# Patient Record
Sex: Male | Born: 1955
Health system: Southern US, Community
[De-identification: ages and names within clinical notes are randomized; demographics above are authoritative.]

## PROBLEM LIST (undated history)

## (undated) DIAGNOSIS — I10 Essential (primary) hypertension: Secondary | ICD-10-CM

## (undated) HISTORY — DX: Essential (primary) hypertension: I10

---

## 2017-06-11 ENCOUNTER — Encounter: Payer: Self-pay | Admitting: Emergency Medicine

## 2017-06-11 ENCOUNTER — Ambulatory Visit (INDEPENDENT_AMBULATORY_CARE_PROVIDER_SITE_OTHER): Payer: 59 | Admitting: Emergency Medicine

## 2017-06-11 VITALS — BP 124/80 | HR 71 | Temp 98.3°F | Resp 16 | Ht 68.25 in | Wt 194.4 lb

## 2017-06-11 DIAGNOSIS — Z0000 Encounter for general adult medical examination without abnormal findings: Secondary | ICD-10-CM

## 2017-06-11 DIAGNOSIS — Z1211 Encounter for screening for malignant neoplasm of colon: Secondary | ICD-10-CM

## 2017-06-11 NOTE — Progress Notes (Signed)
Erik Mcgee 61 y.o.   Chief Complaint  Patient presents with  . Annual Exam    HISTORY OF PRESENT ILLNESS: This is a 61 y.o. male here for annual exam. No complaints or medical concerns. Smoking:no Drinking:no Sleeping:adequate Work:regular hours Exercise:pysically active Stress:low Nutrition:good   HPI   Prior to Admission medications   Not on File    No Known Allergies  There are no active problems to display for this patient.   Past Medical History:  Diagnosis Date  . Hypertension    Takes Losartan/HCTZ but doesn't know the dose.  No past surgical history on file.  Social History   Social History  . Marital status: Married    Spouse name: N/A  . Number of children: N/A  . Years of education: N/A   Occupational History  . Not on file.   Social History Main Topics  . Smoking status: Never Smoker  . Smokeless tobacco: Never Used  . Alcohol use No  . Drug use: No  . Sexual activity: Not on file   Other Topics Concern  . Not on file   Social History Narrative  . No narrative on file    No family history on file.   Review of Systems  Constitutional: Negative.  Negative for chills, fever and malaise/fatigue.  HENT: Negative.  Negative for congestion, hearing loss, nosebleeds, sinus pain and sore throat.   Eyes: Negative.  Negative for blurred vision and double vision.  Respiratory: Negative for sputum production and shortness of breath.   Cardiovascular: Negative.  Negative for chest pain, palpitations, claudication and leg swelling.  Gastrointestinal: Negative.  Negative for abdominal pain, diarrhea, heartburn, nausea and vomiting.  Genitourinary: Negative.  Negative for dysuria, flank pain and hematuria.  Musculoskeletal: Negative.  Negative for myalgias and neck pain.  Skin: Negative.  Negative for rash.  Neurological: Negative.  Negative for dizziness and headaches.  Endo/Heme/Allergies: Negative.   All other systems reviewed and are  negative.  Vitals:   06/11/17 1111  BP: 124/80  Pulse: 71  Resp: 16  Temp: 98.3 F (36.8 C)  SpO2: 97%     Physical Exam  Constitutional: He is oriented to person, place, and time. He appears well-developed and well-nourished.  HENT:  Head: Normocephalic and atraumatic.  Right Ear: External ear normal.  Left Ear: External ear normal.  Nose: Nose normal.  Mouth/Throat: Oropharynx is clear and moist.  Eyes: Pupils are equal, round, and reactive to light. Conjunctivae and EOM are normal.  Neck: Normal range of motion. Neck supple. No JVD present. No thyromegaly present.  Cardiovascular: Normal rate, regular rhythm and normal heart sounds.   Pulmonary/Chest: Effort normal and breath sounds normal.  Abdominal: Soft. Bowel sounds are normal. He exhibits no distension. There is no tenderness.  Musculoskeletal: Normal range of motion. He exhibits no edema or tenderness.  Lymphadenopathy:    He has no cervical adenopathy.  Neurological: He is alert and oriented to person, place, and time. No sensory deficit. He exhibits normal muscle tone.  Skin: Skin is warm and dry. Capillary refill takes less than 2 seconds. No rash noted.  Psychiatric: He has a normal mood and affect. His behavior is normal.  Vitals reviewed.    ASSESSMENT & PLAN: Erik Mcgee was seen today for annual exam.  Diagnoses and all orders for this visit:  Routine general medical examination at a health care facility -     CBC with Differential -     Comprehensive metabolic panel -  Hemoglobin A1c -     Lipid panel -     PSA(Must document that pt has been informed of limitations of PSA testing.) -     TSH -     Hepatitis C antibody screen -     HIV antibody -     Ambulatory referral to Gastroenterology    Patient Instructions       IF you received an x-ray today, you will receive an invoice from [facility]. Please contact [facility] at [phone] with questions or concerns regarding  your invoice.   IF you received labwork today, you will receive an invoice from [facility]. Please contact [facility] at [phone] with questions or concerns regarding your invoice.   Our billing staff will not be able to assist you with questions regarding bills from these companies.  You will be contacted with the lab results as soon as they are available. The fastest way to get your results is to activate your My Chart account. Instructions are located on the last page of this paperwork. If you have not heard from [place] regarding the results in 2 weeks, please contact this office.        Health Maintenance, Male A healthy lifestyle and preventive care is important for your health and wellness. Ask your health care provider about what schedule of regular examinations is right for you. What should I know about weight and diet? Eat a Healthy Diet  Eat plenty of vegetables, fruits, whole grains, low-fat dairy products, and lean protein.  Do not eat a lot of foods high in solid fats, added sugars, or salt.  Maintain a Healthy Weight Regular exercise can help you achieve or maintain a healthy weight. You should:  Do at least 150 minutes of exercise each week. The exercise should increase your heart rate and make you sweat (moderate-intensity exercise).  Do strength-training exercises at least twice a week.  Watch Your Levels of Cholesterol and Blood Lipids  Have your blood tested for lipids and cholesterol every 5 years starting at 60 years of age. If you are at high risk for heart disease, you should start having your blood tested when you are 60 years old. You may need to have your cholesterol levels checked more often if: ? Your lipid or cholesterol levels are high. ? You are older than 60 years of age. ? You are at high risk for heart disease.  What should I know about cancer screening? Many types of cancers can be detected early and may often be prevented. Lung Cancer  You  should be screened every year for lung cancer if: ? You are a current smoker who has smoked for at least 30 years. ? You are a former smoker who has quit within the past 15 years.  Talk to your health care provider about your screening options, when you should start screening, and how often you should be screened.  Colorectal Cancer  Routine colorectal cancer screening usually begins at 60 years of age and should be repeated every 5-10 years until you are 60 years old. You may need to be screened more often if early forms of precancerous polyps or small growths are found. Your health care provider may recommend screening at an earlier age if you have risk factors for colon cancer.  Your health care provider may recommend using home test kits to check for hidden blood in the stool.  A small camera at the end of a tube can be used to examine  your colon (sigmoidoscopy or colonoscopy). This checks for the earliest forms of colorectal cancer.  Prostate and Testicular Cancer  Depending on your age and overall health, your health care provider may do certain tests to screen for prostate and testicular cancer.  Talk to your health care provider about any symptoms or concerns you have about testicular or prostate cancer.  Skin Cancer  Check your skin from head to toe regularly.  Tell your health care provider about any new moles or changes in moles, especially if: ? There is a change in a mole's size, shape, or color. ? You have a mole that is larger than a pencil eraser.  Always use sunscreen. Apply sunscreen liberally and repeat throughout the day.  Protect yourself by wearing long sleeves, pants, a wide-brimmed hat, and sunglasses when outside.  What should I know about heart disease, diabetes, and high blood pressure?  If you are 18-61 years of age, have your blood pressure checked every 3-5 years. If you are 16 years of age or older, have your blood pressure checked every year. You  should have your blood pressure measured twice-once when you are at a hospital or clinic, and once when you are not at a hospital or clinic. Record the average of the two measurements. To check your blood pressure when you are not at a hospital or clinic, you can use: ? An automated blood pressure machine at a pharmacy. ? A home blood pressure monitor.  Talk to your health care provider about your target blood pressure.  If you are between 10-13 years old, ask your health care provider if you should take aspirin to prevent heart disease.  Have regular diabetes screenings by checking your fasting blood sugar level. ? If you are at a normal weight and have a low risk for diabetes, have this test once every three years after the age of 47. ? If you are overweight and have a high risk for diabetes, consider being tested at a younger age or more often.  A one-time screening for abdominal aortic aneurysm (AAA) by ultrasound is recommended for men aged 65-75 years who are current or former smokers. What should I know about preventing infection? Hepatitis B If you have a higher risk for hepatitis B, you should be screened for this virus. Talk with your health care provider to find out if you are at risk for hepatitis B infection. Hepatitis C Blood testing is recommended for:  Everyone born from [birth year] through 1964.  Anyone with known risk factors for hepatitis C.  Sexually Transmitted Diseases (STDs)  You should be screened each year for STDs including gonorrhea and chlamydia if: ? You are sexually active and are younger than 60 years of age. ? You are older than 60 years of age and your health care provider tells you that you are at risk for this type of infection. ? Your sexual activity has changed since you were last screened and you are at an increased risk for chlamydia or gonorrhea. Ask your health care provider if you are at risk.  Talk with your health care provider about whether you are  at high risk of being infected with HIV. Your health care provider may recommend a prescription medicine to help prevent HIV infection.  What else can I do?  Schedule regular health, dental, and eye exams.  Stay current with your vaccines (immunizations).  Do not use any tobacco products, such as cigarettes, chewing tobacco, and e-cigarettes. If you need help  quitting, ask your health care provider.  Limit alcohol intake to no more than 2 drinks per day. One drink equals 12 ounces of beer, 5 ounces of wine, or 1 ounces of hard liquor.  Do not use street drugs.  Do not share needles.  Ask your health care provider for help if you need support or information about quitting drugs.  Tell your health care provider if you often feel depressed.  Tell your health care provider if you have ever been abused or do not feel safe at home. This information is not intended to replace advice given to you by your health care provider. Make sure you discuss any questions you have with your health care provider. Document Released: 02/13/2008 Document Revised: 04/15/2016 Document Reviewed: 05/21/2015 Elsevier Interactive Patient Education  2018 [employer].  American Heart Association (AHA) Exercise Recommendation  Being physically active is important to prevent heart disease and stroke, the nation's No. 1and No. 5killers. To improve overall cardiovascular health, we suggest at least 150 minutes per week of moderate exercise or 75 minutes per week of vigorous exercise (or a combination of moderate and vigorous activity). Thirty minutes a day, five times a week is an easy goal to remember. You will also experience benefits even if you divide your time into two or three segments of 10 to 15 minutes per day.  For people who would benefit from lowering their blood pressure or cholesterol, we recommend 40 minutes of aerobic exercise of moderate to vigorous intensity three to four times a week to lower the  risk for heart attack and stroke.  Physical activity is anything that makes you move your body and burn calories.  This includes things like climbing stairs or playing sports. Aerobic exercises benefit your heart, and include walking, jogging, swimming or biking. Strength and stretching exercises are best for overall stamina and flexibility.  The simplest, positive change you can make to effectively improve your heart health is to start walking. It's enjoyable, free, easy, social and great exercise. A walking program is flexible and boasts high success rates because people can stick with it. It's easy for walking to become a regular and satisfying part of life.   For Overall Cardiovascular Health:  At least 30 minutes of moderate-intensity aerobic activity at least 5 days per week for a total of 150  OR   At least 25 minutes of vigorous aerobic activity at least 3 days per week for a total of 75 minutes; or a combination of moderate- and vigorous-intensity aerobic activity  AND   Moderate- to high-intensity muscle-strengthening activity at least 2 days per week for additional health benefits.  For Lowering Blood Pressure and Cholesterol  An average 40 minutes of moderate- to vigorous-intensity aerobic activity 3 or 4 times per week  What if I can't make it to the time goal? Something is always better than nothing! And everyone has to start somewhere. Even if you've been sedentary for years, today is the day you can begin to make healthy changes in your life. If you don't think you'll make it for 30 or 40 minutes, set a reachable goal for today. You can work up toward your overall goal by increasing your time as you get stronger. Don't let all-or-nothing thinking rob you of doing what you can every day.  Source:[email or web address], MD [facility]

## 2017-06-11 NOTE — Patient Instructions (Addendum)
   IF you received an x-ray today, you will receive an invoice from [facility]. Please contact [facility] at [phone] with questions or concerns regarding your invoice.   IF you received labwork today, you will receive an invoice from [facility]. Please contact [facility] at [phone] with questions or concerns regarding your invoice.   Our billing staff will not be able to assist you with questions regarding bills from these companies.  You will be contacted with the lab results as soon as they are available. The fastest way to get your results is to activate your My Chart account. Instructions are located on the last page of this paperwork. If you have not heard from us regarding the results in 2 weeks, please contact this office.      Health Maintenance, Male A healthy lifestyle and preventive care is important for your health and wellness. Ask your health care provider about what schedule of regular examinations is right for you. What should I know about weight and diet? Eat a Healthy Diet  Eat plenty of vegetables, fruits, whole grains, low-fat dairy products, and lean protein.  Do not eat a lot of foods high in solid fats, added sugars, or salt.  Maintain a Healthy Weight Regular exercise can help you achieve or maintain a healthy weight. You should:  Do at least 150 minutes of exercise each week. The exercise should increase your heart rate and make you sweat (moderate-intensity exercise).  Do strength-training exercises at least twice a week.  Watch Your Levels of Cholesterol and Blood Lipids  Have your blood tested for lipids and cholesterol every 5 years starting at 60 years of age. If you are at high risk for heart disease, you should start having your blood tested when you are 60 years old. You may need to have your cholesterol levels checked more often if: ? Your lipid or cholesterol levels are high. ? You are older than 60 years of age. ? You  are at high risk for heart disease.  What should I know about cancer screening? Many types of cancers can be detected early and may often be prevented. Lung Cancer  You should be screened every year for lung cancer if: ? You are a current smoker who has smoked for at least 30 years. ? You are a former smoker who has quit within the past 15 years.  Talk to your health care provider about your screening options, when you should start screening, and how often you should be screened.  Colorectal Cancer  Routine colorectal cancer screening usually begins at 60 years of age and should be repeated every 5-10 years until you are 60 years old. You may need to be screened more often if early forms of precancerous polyps or small growths are found. Your health care provider may recommend screening at an earlier age if you have risk factors for colon cancer.  Your health care provider may recommend using home test kits to check for hidden blood in the stool.  A small camera at the end of a tube can be used to examine your colon (sigmoidoscopy or colonoscopy). This checks for the earliest forms of colorectal cancer.  Prostate and Testicular Cancer  Depending on your age and overall health, your health care provider may do certain tests to screen for prostate and testicular cancer.  Talk to your health care provider about any symptoms or concerns you have about testicular or prostate cancer.  Skin Cancer  Check your skin   from head to toe regularly.  Tell your health care provider about any new moles or changes in moles, especially if: ? There is a change in a mole's size, shape, or color. ? You have a mole that is larger than a pencil eraser.  Always use sunscreen. Apply sunscreen liberally and repeat throughout the day.  Protect yourself by wearing long sleeves, pants, a wide-brimmed hat, and sunglasses when outside.  What should I know about heart disease, diabetes, and high blood  pressure?  If you are 18-39 years of age, have your blood pressure checked every 3-5 years. If you are 40 years of age or older, have your blood pressure checked every year. You should have your blood pressure measured twice-once when you are at a hospital or clinic, and once when you are not at a hospital or clinic. Record the average of the two measurements. To check your blood pressure when you are not at a hospital or clinic, you can use: ? An automated blood pressure machine at a pharmacy. ? A home blood pressure monitor.  Talk to your health care provider about your target blood pressure.  If you are between 45-79 years old, ask your health care provider if you should take aspirin to prevent heart disease.  Have regular diabetes screenings by checking your fasting blood sugar level. ? If you are at a normal weight and have a low risk for diabetes, have this test once every three years after the age of 45. ? If you are overweight and have a high risk for diabetes, consider being tested at a younger age or more often.  A one-time screening for abdominal aortic aneurysm (AAA) by ultrasound is recommended for men aged 65-75 years who are current or former smokers. What should I know about preventing infection? Hepatitis B If you have a higher risk for hepatitis B, you should be screened for this virus. Talk with your health care provider to find out if you are at risk for hepatitis B infection. Hepatitis C Blood testing is recommended for:  Everyone born from 1945 through 1965.  Anyone with known risk factors for hepatitis C.  Sexually Transmitted Diseases (STDs)  You should be screened each year for STDs including gonorrhea and chlamydia if: ? You are sexually active and are younger than 60 years of age. ? You are older than 60 years of age and your health care provider tells you that you are at risk for this type of infection. ? Your sexual activity has changed since you were last  screened and you are at an increased risk for chlamydia or gonorrhea. Ask your health care provider if you are at risk.  Talk with your health care provider about whether you are at high risk of being infected with HIV. Your health care provider may recommend a prescription medicine to help prevent HIV infection.  What else can I do?  Schedule regular health, dental, and eye exams.  Stay current with your vaccines (immunizations).  Do not use any tobacco products, such as cigarettes, chewing tobacco, and e-cigarettes. If you need help quitting, ask your health care provider.  Limit alcohol intake to no more than 2 drinks per day. One drink equals 12 ounces of beer, 5 ounces of wine, or 1 ounces of hard liquor.  Do not use street drugs.  Do not share needles.  Ask your health care provider for help if you need support or information about quitting drugs.  Tell your health care   provider if you often feel depressed.  Tell your health care provider if you have ever been abused or do not feel safe at home. This information is not intended to replace advice given to you by your health care provider. Make sure you discuss any questions you have with your health care provider. Document Released: 02/13/2008 Document Revised: 04/15/2016 Document Reviewed: 05/21/2015 Elsevier Interactive Patient Education  2018 [employer]  American Heart Association (AHA) Exercise Recommendation  Being physically active is important to prevent heart disease and stroke, the nation's No. 1and No. 5killers. To improve overall cardiovascular health, we suggest at least 150 minutes per week of moderate exercise or 75 minutes per week of vigorous exercise (or a combination of moderate and vigorous activity). Thirty minutes a day, five times a week is an easy goal to remember. You will also experience benefits even if you divide your time into two or three segments of 10 to 15 minutes per day.  For people who would  benefit from lowering their blood pressure or cholesterol, we recommend 40 minutes of aerobic exercise of moderate to vigorous intensity three to four times a week to lower the risk for heart attack and stroke.  Physical activity is anything that makes you move your body and burn calories.  This includes things like climbing stairs or playing sports. Aerobic exercises benefit your heart, and include walking, jogging, swimming or biking. Strength and stretching exercises are best for overall stamina and flexibility.  The simplest, positive change you can make to effectively improve your heart health is to start walking. It's enjoyable, free, easy, social and great exercise. A walking program is flexible and boasts high success rates because people can stick with it. It's easy for walking to become a regular and satisfying part of life.   For Overall Cardiovascular Health:  At least 30 minutes of moderate-intensity aerobic activity at least 5 days per week for a total of 150  OR   At least 25 minutes of vigorous aerobic activity at least 3 days per week for a total of 75 minutes; or a combination of moderate- and vigorous-intensity aerobic activity  AND   Moderate- to high-intensity muscle-strengthening activity at least 2 days per week for additional health benefits.  For Lowering Blood Pressure and Cholesterol  An average 40 minutes of moderate- to vigorous-intensity aerobic activity 3 or 4 times per week  What if I can't make it to the time goal? Something is always better than nothing! And everyone has to start somewhere. Even if you've been sedentary for years, today is the day you can begin to make healthy changes in your life. If you don't think you'll make it for 30 or 40 minutes, set a reachable goal for today. You can work up toward your overall goal by increasing your time as you get stronger. Don't let all-or-nothing thinking rob you of doing what you can every day.   Source:http://www.heart.org    

## 2017-06-13 LAB — CBC WITH DIFFERENTIAL/PLATELET
BASOS ABS: 0 10*3/uL (ref 0.0–0.2)
Basos: 0 %
EOS (ABSOLUTE): 0 10*3/uL (ref 0.0–0.4)
Eos: 1 %
HEMATOCRIT: 43 % (ref 37.5–51.0)
Hemoglobin: 13.9 g/dL (ref 13.0–17.7)
Immature Grans (Abs): 0 10*3/uL (ref 0.0–0.1)
Immature Granulocytes: 0 %
LYMPHS ABS: 1.9 10*3/uL (ref 0.7–3.1)
Lymphs: 36 %
MCH: 27 pg (ref 26.6–33.0)
MCHC: 32.3 g/dL (ref 31.5–35.7)
MCV: 84 fL (ref 79–97)
MONOS ABS: 0.4 10*3/uL (ref 0.1–0.9)
Monocytes: 8 %
Neutrophils Absolute: 2.9 10*3/uL (ref 1.4–7.0)
Neutrophils: 55 %
Platelets: 221 10*3/uL (ref 150–379)
RBC: 5.15 x10E6/uL (ref 4.14–5.80)
RDW: 14.7 % (ref 12.3–15.4)
WBC: 5.3 10*3/uL (ref 3.4–10.8)

## 2017-06-13 LAB — PSA: PROSTATE SPECIFIC AG, SERUM: 0.9 ng/mL (ref 0.0–4.0)

## 2017-06-13 LAB — COMPREHENSIVE METABOLIC PANEL
A/G RATIO: 1.9 (ref 1.2–2.2)
ALT: 23 IU/L (ref 0–44)
AST: 20 IU/L (ref 0–40)
Albumin: 4.7 g/dL (ref 3.6–4.8)
Alkaline Phosphatase: 84 IU/L (ref 39–117)
BILIRUBIN TOTAL: 0.7 mg/dL (ref 0.0–1.2)
BUN / CREAT RATIO: 25 — AB (ref 10–24)
BUN: 16 mg/dL (ref 8–27)
CALCIUM: 9.6 mg/dL (ref 8.6–10.2)
CHLORIDE: 99 mmol/L (ref 96–106)
CO2: 28 mmol/L (ref 20–29)
Creatinine, Ser: 0.63 mg/dL — ABNORMAL LOW (ref 0.76–1.27)
GFR, EST AFRICAN AMERICAN: 124 mL/min/{1.73_m2} (ref >59)
GFR, EST NON AFRICAN AMERICAN: 107 mL/min/{1.73_m2} (ref >59)
GLOBULIN, TOTAL: 2.5 g/dL (ref 1.5–4.5)
Glucose: 113 mg/dL — ABNORMAL HIGH (ref 65–99)
POTASSIUM: 4.4 mmol/L (ref 3.5–5.2)
SODIUM: 139 mmol/L (ref 134–144)
TOTAL PROTEIN: 7.2 g/dL (ref 6.0–8.5)

## 2017-06-13 LAB — LIPID PANEL
CHOL/HDL RATIO: 2.7 ratio (ref 0.0–5.0)
Cholesterol, Total: 162 mg/dL (ref 100–199)
HDL: 59 mg/dL (ref >39)
LDL CALC: 89 mg/dL (ref 0–99)
Triglycerides: 68 mg/dL (ref 0–149)
VLDL CHOLESTEROL CAL: 14 mg/dL (ref 5–40)

## 2017-06-13 LAB — HEMOGLOBIN A1C
Est. average glucose Bld gHb Est-mCnc: 157 mg/dL
Hgb A1c MFr Bld: 7.1 % — ABNORMAL HIGH (ref 4.8–5.6)

## 2017-06-13 LAB — TSH: TSH: 2.02 u[IU]/mL (ref 0.450–4.500)

## 2017-06-13 LAB — HIV ANTIBODY (ROUTINE TESTING W REFLEX): HIV Screen 4th Generation wRfx: NONREACTIVE

## 2017-06-13 LAB — HEPATITIS C ANTIBODY: Hep C Virus Ab: <0.1 s/co ratio (ref 0.0–0.9)

## 2017-06-15 ENCOUNTER — Telehealth: Payer: Self-pay | Admitting: Emergency Medicine

## 2017-06-15 ENCOUNTER — Other Ambulatory Visit: Payer: Self-pay | Admitting: Emergency Medicine

## 2017-06-15 DIAGNOSIS — R7309 Other abnormal glucose: Secondary | ICD-10-CM

## 2017-06-15 DIAGNOSIS — R739 Hyperglycemia, unspecified: Secondary | ICD-10-CM

## 2017-06-15 MED ORDER — METFORMIN HCL 500 MG PO TABS: 500.0000 mg | ORAL_TABLET | Freq: Two times a day (BID) | ORAL | 3 refills | Status: DC

## 2017-06-15 NOTE — Telephone Encounter (Signed)
Done. Thanks.

## 2017-06-15 NOTE — Telephone Encounter (Signed)
Referral sent. Thanks.

## 2017-06-15 NOTE — Telephone Encounter (Signed)
Pt referral for Gastroenterology to have colonoscopy has diagnosis of Routine Medical Exam. Can we get this changed to a diagnosis to have a colonoscopy? Thanks!

## 2017-07-28 ENCOUNTER — Other Ambulatory Visit: Payer: Self-pay

## 2017-07-28 ENCOUNTER — Encounter: Payer: Self-pay | Admitting: Emergency Medicine

## 2017-07-28 ENCOUNTER — Ambulatory Visit: Payer: 59 | Admitting: Emergency Medicine

## 2017-07-28 VITALS — BP 126/80 | HR 87 | Temp 97.6°F | Resp 16 | Ht 68.25 in | Wt 189.6 lb

## 2017-07-28 DIAGNOSIS — R7309 Other abnormal glucose: Secondary | ICD-10-CM | POA: Insufficient documentation

## 2017-07-28 DIAGNOSIS — I10 Essential (primary) hypertension: Secondary | ICD-10-CM

## 2017-07-28 DIAGNOSIS — Z8639 Personal history of other endocrine, nutritional and metabolic disease: Secondary | ICD-10-CM | POA: Diagnosis not present

## 2017-07-28 DIAGNOSIS — E1159 Type 2 diabetes mellitus with other circulatory complications: Secondary | ICD-10-CM | POA: Insufficient documentation

## 2017-07-28 MED ORDER — LOSARTAN POTASSIUM 25 MG PO TABS: 25.0000 mg | ORAL_TABLET | Freq: Every day | ORAL | 3 refills | Status: DC

## 2017-07-28 NOTE — Assessment & Plan Note (Signed)
Pt wants to try diet modification before starting Metformin; will schedule repeat A1C in 2 months.

## 2017-07-28 NOTE — Patient Instructions (Addendum)
   IF you received an x-ray today, you will receive an invoice from [facility]. Please contact  Radiology at [phone] with questions or concerns regarding your invoice.   IF you received labwork today, you will receive an invoice from [facility]. Please contact [facility] at [phone] with questions or concerns regarding your invoice.   Our billing staff will not be able to assist you with questions regarding bills from these companies.  You will be contacted with the lab results as soon as they are available. The fastest way to get your results is to activate your My Chart account. Instructions are located on the last page of this paperwork. If you have not heard from us regarding the results in 2 weeks, please contact this office.     Hypertension Hypertension, commonly called high blood pressure, is when the force of blood pumping through the arteries is too strong. The arteries are the blood vessels that carry blood from the heart throughout the body. Hypertension forces the heart to work harder to pump blood and may cause arteries to become narrow or stiff. Having untreated or uncontrolled hypertension can cause heart attacks, strokes, kidney disease, and other problems. A blood pressure reading consists of a higher number over a lower number. Ideally, your blood pressure should be below 120/80. The first ("top") number is called the systolic pressure. It is a measure of the pressure in your arteries as your heart beats. The second ("bottom") number is called the diastolic pressure. It is a measure of the pressure in your arteries as the heart relaxes. What are the causes? The cause of this condition is not known. What increases the risk? Some risk factors for high blood pressure are under your control. Others are not. Factors you can change  Smoking.  Having type 2 diabetes mellitus, high cholesterol, or both.  Not getting enough exercise or physical  activity.  Being overweight.  Having too much fat, sugar, calories, or salt (sodium) in your diet.  Drinking too much alcohol. Factors that are difficult or impossible to change  Having chronic kidney disease.  Having a family history of high blood pressure.  Age. Risk increases with age.  Race. You may be at higher risk if you are African-American.  Gender. Men are at higher risk than women before age 45. After age 65, women are at higher risk than men.  Having obstructive sleep apnea.  Stress. What are the signs or symptoms? Extremely high blood pressure (hypertensive crisis) may cause:  Headache.  Anxiety.  Shortness of breath.  Nosebleed.  Nausea and vomiting.  Severe chest pain.  Jerky movements you cannot control (seizures).  How is this diagnosed? This condition is diagnosed by measuring your blood pressure while you are seated, with your arm resting on a surface. The cuff of the blood pressure monitor will be placed directly against the skin of your upper arm at the level of your heart. It should be measured at least twice using the same arm. Certain conditions can cause a difference in blood pressure between your right and left arms. Certain factors can cause blood pressure readings to be lower or higher than normal (elevated) for a short period of time:  When your blood pressure is higher when you are in a health care provider's office than when you are at home, this is called white coat hypertension. Most people with this condition do not need medicines.  When your blood pressure is higher at home than when you   are in a health care provider's office, this is called masked hypertension. Most people with this condition may need medicines to control blood pressure.  If you have a high blood pressure reading during one visit or you have normal blood pressure with other risk factors:  You may be asked to return on a different day to have your blood pressure  checked again.  You may be asked to monitor your blood pressure at home for 1 week or longer.  If you are diagnosed with hypertension, you may have other blood or imaging tests to help your health care provider understand your overall risk for other conditions. How is this treated? This condition is treated by making healthy lifestyle changes, such as eating healthy foods, exercising more, and reducing your alcohol intake. Your health care provider may prescribe medicine if lifestyle changes are not enough to get your blood pressure under control, and if:  Your systolic blood pressure is above 130.  Your diastolic blood pressure is above 80.  Your personal target blood pressure may vary depending on your medical conditions, your age, and other factors. Follow these instructions at home: Eating and drinking  Eat a diet that is high in fiber and potassium, and low in sodium, added sugar, and fat. An example eating plan is called the DASH (Dietary Approaches to Stop Hypertension) diet. To eat this way: ? Eat plenty of fresh fruits and vegetables. Try to fill half of your plate at each meal with fruits and vegetables. ? Eat whole grains, such as whole wheat pasta, brown rice, or whole grain bread. Fill about one quarter of your plate with whole grains. ? Eat or drink low-fat dairy products, such as skim milk or low-fat yogurt. ? Avoid fatty cuts of meat, processed or cured meats, and poultry with skin. Fill about one quarter of your plate with lean proteins, such as fish, chicken without skin, beans, eggs, and tofu. ? Avoid premade and processed foods. These tend to be higher in sodium, added sugar, and fat.  Reduce your daily sodium intake. Most people with hypertension should eat less than 1,500 mg of sodium a day.  Limit alcohol intake to no more than 1 drink a day for nonpregnant women and 2 drinks a day for men. One drink equals 12 oz of beer, 5 oz of wine, or 1 oz of hard  liquor. Lifestyle  Work with your health care provider to maintain a healthy body weight or to lose weight. Ask what an ideal weight is for you.  Get at least 30 minutes of exercise that causes your heart to beat faster (aerobic exercise) most days of the week. Activities may include walking, swimming, or biking.  Include exercise to strengthen your muscles (resistance exercise), such as pilates or lifting weights, as part of your weekly exercise routine. Try to do these types of exercises for 30 minutes at least 3 days a week.  Do not use any products that contain nicotine or tobacco, such as cigarettes and e-cigarettes. If you need help quitting, ask your health care provider.  Monitor your blood pressure at home as told by your health care provider.  Keep all follow-up visits as told by your health care provider. This is important. Medicines  Take over-the-counter and prescription medicines only as told by your health care provider. Follow directions carefully. Blood pressure medicines must be taken as prescribed.  Do not skip doses of blood pressure medicine. Doing this puts you at risk for problems and   can make the medicine less effective.  Ask your health care provider about side effects or reactions to medicines that you should watch for. Contact a health care provider if:  You think you are having a reaction to a medicine you are taking.  You have headaches that keep coming back (recurring).  You feel dizzy.  You have swelling in your ankles.  You have trouble with your vision. Get help right away if:  You develop a severe headache or confusion.  You have unusual weakness or numbness.  You feel faint.  You have severe pain in your chest or abdomen.  You vomit repeatedly.  You have trouble breathing. Summary  Hypertension is when the force of blood pumping through your arteries is too strong. If this condition is not controlled, it may put you at risk for serious  complications.  Your personal target blood pressure may vary depending on your medical conditions, your age, and other factors. For most people, a normal blood pressure is less than 120/80.  Hypertension is treated with lifestyle changes, medicines, or a combination of both. Lifestyle changes include weight loss, eating a healthy, low-sodium diet, exercising more, and limiting alcohol. This information is not intended to replace advice given to you by your health care provider. Make sure you discuss any questions you have with your health care provider. Document Released: 08/17/2005 Document Revised: 07/15/2016 Document Reviewed: 07/15/2016 Elsevier Interactive Patient Education  2018 [employer]  

## 2017-07-28 NOTE — Assessment & Plan Note (Addendum)
Well controlled. Will continue Losartan.

## 2017-07-28 NOTE — Progress Notes (Signed)
Erik Mcgee 61 y.o.   Chief Complaint  Patient presents with  . Medication Refill    Losartan    HISTORY OF PRESENT ILLNESS: This is a 61 y.o. male with h/o HTN needs medication refill; asymptomatic. Seen last by me 10/12 and glucose/A1C were elevated;  Lab Results  Component Value Date   HGBA1C 7.1 (H) 06/11/2017  Recommended to start taking Metformin but did not start; has been on Metformin before but stopped because of too much weight loss. Labs reviewed with patient. Had colonoscopy done yesterday.  HPI   Prior to Admission medications   Medication Sig Start Date End Date Taking? Authorizing Provider  losartan (COZAAR) 25 MG tablet Take 25 mg by mouth daily.   Yes [provider]  metFORMIN (GLUCOPHAGE) 500 MG tablet Take 1 tablet (500 mg total) by mouth 2 (two) times daily with a meal. 06/15/17  Yes Corryn Madewell, Eilleen Kempf, MD    No Known Allergies  There are no active problems to display for this patient.   Past Medical History:  Diagnosis Date  . Hypertension       Social History   Socioeconomic History  . Marital status: Married    Spouse name: Not on file  . Number of children: Not on file  . Years of education: Not on file  . Highest education level: Not on file  Social Needs  . Financial resource strain: Not on file  . Food insecurity - worry: Not on file  . Food insecurity - inability: Not on file  . Transportation needs - medical: Not on file  . Transportation needs - non-medical: Not on file  Occupational History  . Not on file  Tobacco Use  . Smoking status: Never Smoker  . Smokeless tobacco: Never Used  Substance and Sexual Activity  . Alcohol use: No  . Drug use: No  . Sexual activity: Not on file  Other Topics Concern  . Not on file  Social History Narrative  . Not on file    No family history on file.   Review of Systems  Constitutional: Negative.  Negative for chills, fever and weight loss.  HENT: Negative.   Eyes:  Negative.  Negative for blurred vision and double vision.  Respiratory: Negative.  Negative for cough, hemoptysis and shortness of breath.   Cardiovascular: Negative.  Negative for chest pain, palpitations, claudication and leg swelling.  Gastrointestinal: Negative.  Negative for abdominal pain, blood in stool, diarrhea, nausea and vomiting.  Genitourinary: Negative.  Negative for dysuria and hematuria.  Musculoskeletal: Positive for joint pain. Negative for back pain, myalgias and neck pain.  Skin: Negative.  Negative for rash.  Neurological: Negative.  Negative for dizziness and headaches.  Endo/Heme/Allergies: Negative.   All other systems reviewed and are negative.  Vitals:   07/28/17 1628  BP: 126/80  Pulse: 87  Resp: 16  Temp: 97.6 F (36.4 C)  SpO2: 99%     Physical Exam  Constitutional: He is oriented to person, place, and time. He appears well-developed and well-nourished.  HENT:  Head: Normocephalic and atraumatic.  Nose: Nose normal.  Mouth/Throat: Oropharynx is clear and moist.  Eyes: Conjunctivae and EOM are normal. Pupils are equal, round, and reactive to light.  Bilateral pterigium  Neck: Normal range of motion. Neck supple. No JVD present.  Cardiovascular: Normal rate, regular rhythm, normal heart sounds and intact distal pulses.  Pulmonary/Chest: Effort normal and breath sounds normal.  Abdominal: Soft. Bowel sounds are normal. He exhibits no  distension. There is no tenderness.  Musculoskeletal: Normal range of motion.  Lymphadenopathy:    He has no cervical adenopathy.  Neurological: He is alert and oriented to person, place, and time. No sensory deficit. He exhibits normal muscle tone.  Skin: Skin is warm and dry. Capillary refill takes less than 2 seconds. No rash noted.  Psychiatric: He has a normal mood and affect. His behavior is normal.  Vitals reviewed.  Elevated hemoglobin A1c Pt wants to try diet modification before starting Metformin; will  schedule repeat A1C in 2 months.  Essential hypertension Well controlled. Will continue Losartan.    ASSESSMENT & PLAN: Erik Mcgee was seen today for medication refill.  Diagnoses and all orders for this visit:  Essential hypertension  History of hyperglycemia  Elevated hemoglobin A1c  Other orders -     losartan (COZAAR) 25 MG tablet; Take 1 tablet (25 mg total) by mouth daily.    Patient Instructions       IF you received an x-ray today, you will receive an invoice from [facility]. Please contact [facility] at [phone] with questions or concerns regarding your invoice.   IF you received labwork today, you will receive an invoice from [facility]. Please contact [facility] at [phone] with questions or concerns regarding your invoice.   Our billing staff will not be able to assist you with questions regarding bills from these companies.  You will be contacted with the lab results as soon as they are available. The fastest way to get your results is to activate your My Chart account. Instructions are located on the last page of this paperwork. If you have not heard from us regarding the results in 2 weeks, please contact this office.     Hypertension Hypertension, commonly called high blood pressure, is when the force of blood pumping through the arteries is too strong. The arteries are the blood vessels that carry blood from the heart throughout the body. Hypertension forces the heart to work harder to pump blood and may cause arteries to become narrow or stiff. Having untreated or uncontrolled hypertension can cause heart attacks, strokes, kidney disease, and other problems. A blood pressure reading consists of a higher number over a lower number. Ideally, your blood pressure should be below 120/80. The first ("top") number is called the systolic pressure. It is a measure of the pressure in your arteries as your heart beats. The second ("bottom") number  is called the diastolic pressure. It is a measure of the pressure in your arteries as the heart relaxes. What are the causes? The cause of this condition is not known. What increases the risk? Some risk factors for high blood pressure are under your control. Others are not. Factors you can change  Smoking.  Having type 2 diabetes mellitus, high cholesterol, or both.  Not getting enough exercise or physical activity.  Being overweight.  Having too much fat, sugar, calories, or salt (sodium) in your diet.  Drinking too much alcohol. Factors that are difficult or impossible to change  Having chronic kidney disease.  Having a family history of high blood pressure.  Age. Risk increases with age.  Race. You may be at higher risk if you are African-American.  Gender. Men are at higher risk than women before age 60. After age 60, women are at higher risk than men.  Having obstructive sleep apnea.  Stress. What are the signs or symptoms? Extremely high blood pressure (hypertensive crisis) may cause:  Headache.  Anxiety.  Shortness of breath.  Nosebleed.  Nausea and vomiting.  Severe chest pain.  Jerky movements you cannot control (seizures).  How is this diagnosed? This condition is diagnosed by measuring your blood pressure while you are seated, with your arm resting on a surface. The cuff of the blood pressure monitor will be placed directly against the skin of your upper arm at the level of your heart. It should be measured at least twice using the same arm. Certain conditions can cause a difference in blood pressure between your right and left arms. Certain factors can cause blood pressure readings to be lower or higher than normal (elevated) for a short period of time:  When your blood pressure is higher when you are in a health care provider's office than when you are at home, this is called white coat hypertension. Most people with this condition do not need  medicines.  When your blood pressure is higher at home than when you are in a health care provider's office, this is called masked hypertension. Most people with this condition may need medicines to control blood pressure.  If you have a high blood pressure reading during one visit or you have normal blood pressure with other risk factors:  You may be asked to return on a different day to have your blood pressure checked again.  You may be asked to monitor your blood pressure at home for 1 week or longer.  If you are diagnosed with hypertension, you may have other blood or imaging tests to help your health care provider understand your overall risk for other conditions. How is this treated? This condition is treated by making healthy lifestyle changes, such as eating healthy foods, exercising more, and reducing your alcohol intake. Your health care provider may prescribe medicine if lifestyle changes are not enough to get your blood pressure under control, and if:  Your systolic blood pressure is above 130.  Your diastolic blood pressure is above 80.  Your personal target blood pressure may vary depending on your medical conditions, your age, and other factors. Follow these instructions at home: Eating and drinking  Eat a diet that is high in fiber and potassium, and low in sodium, added sugar, and fat. An example eating plan is called the DASH (Dietary Approaches to Stop Hypertension) diet. To eat this way: ? Eat plenty of fresh fruits and vegetables. Try to fill half of your plate at each meal with fruits and vegetables. ? Eat whole grains, such as whole wheat pasta, brown rice, or whole grain bread. Fill about one quarter of your plate with whole grains. ? Eat or drink low-fat dairy products, such as skim milk or low-fat yogurt. ? Avoid fatty cuts of meat, processed or cured meats, and poultry with skin. Fill about one quarter of your plate with lean proteins, such as fish, chicken  without skin, beans, eggs, and tofu. ? Avoid premade and processed foods. These tend to be higher in sodium, added sugar, and fat.  Reduce your daily sodium intake. Most people with hypertension should eat less than 1,500 mg of sodium a day.  Limit alcohol intake to no more than 1 drink a day for nonpregnant women and 2 drinks a day for men. One drink equals 12 oz of beer, 5 oz of wine, or 1 oz of hard liquor. Lifestyle  Work with your health care provider to maintain a healthy body weight or to lose weight. Ask what an ideal weight is for you.  Get at least 30 minutes of exercise that causes your heart to beat faster (aerobic exercise) most days of the week. Activities may include walking, swimming, or biking.  Include exercise to strengthen your muscles (resistance exercise), such as pilates or lifting weights, as part of your weekly exercise routine. Try to do these types of exercises for 30 minutes at least 3 days a week.  Do not use any products that contain nicotine or tobacco, such as cigarettes and e-cigarettes. If you need help quitting, ask your health care provider.  Monitor your blood pressure at home as told by your health care provider.  Keep all follow-up visits as told by your health care provider. This is important. Medicines  Take over-the-counter and prescription medicines only as told by your health care provider. Follow directions carefully. Blood pressure medicines must be taken as prescribed.  Do not skip doses of blood pressure medicine. Doing this puts you at risk for problems and can make the medicine less effective.  Ask your health care provider about side effects or reactions to medicines that you should watch for. Contact a health care provider if:  You think you are having a reaction to a medicine you are taking.  You have headaches that keep coming back (recurring).  You feel dizzy.  You have swelling in your ankles.  You have trouble with your  vision. Get help right away if:  You develop a severe headache or confusion.  You have unusual weakness or numbness.  You feel faint.  You have severe pain in your chest or abdomen.  You vomit repeatedly.  You have trouble breathing. Summary  Hypertension is when the force of blood pumping through your arteries is too strong. If this condition is not controlled, it may put you at risk for serious complications.  Your personal target blood pressure may vary depending on your medical conditions, your age, and other factors. For most people, a normal blood pressure is less than 120/80.  Hypertension is treated with lifestyle changes, medicines, or a combination of both. Lifestyle changes include weight loss, eating a healthy, low-sodium diet, exercising more, and limiting alcohol. This information is not intended to replace advice given to you by your health care provider. Make sure you discuss any questions you have with your health care provider. Document Released: 08/17/2005 Document Revised: 07/15/2016 Document Reviewed: 07/15/2016 Elsevier Interactive Patient Education  2018 [employer]      Edwina Barth, MD [facility]

## 2017-09-10 ENCOUNTER — Other Ambulatory Visit: Payer: Self-pay

## 2017-09-10 ENCOUNTER — Other Ambulatory Visit: Payer: Self-pay | Admitting: *Deleted

## 2017-09-10 ENCOUNTER — Ambulatory Visit: Payer: 59 | Admitting: Emergency Medicine

## 2017-09-10 ENCOUNTER — Encounter: Payer: Self-pay | Admitting: Emergency Medicine

## 2017-09-10 VITALS — BP 130/78 | HR 86 | Temp 98.4°F | Resp 16 | Ht 68.0 in | Wt 198.2 lb

## 2017-09-10 DIAGNOSIS — R079 Chest pain, unspecified: Secondary | ICD-10-CM | POA: Insufficient documentation

## 2017-09-10 DIAGNOSIS — Z8719 Personal history of other diseases of the digestive system: Secondary | ICD-10-CM | POA: Diagnosis not present

## 2017-09-10 NOTE — Progress Notes (Signed)
Shriyan Arakawa 62 y.o.   Chief Complaint  Patient presents with  . Chest Pain    x 2-3 days - per patient feels heart beating fast    HISTORY OF PRESENT ILLNESS: This is a 62 y.o. male complaining of pressure across chest x 2-3 days; only at night when he lies down; physically active; no chest pain on exertion; increased stress; no associated symptoms. No prior Hx. of heart disease.   HPI   Prior to Admission medications   Medication Sig Start Date End Date Taking? Authorizing Provider  losartan (COZAAR) 25 MG tablet Take 1 tablet (25 mg total) by mouth daily. 07/28/17 10/26/17 Yes Perl Folmar, Eilleen Kempf, MD  metFORMIN (GLUCOPHAGE) 500 MG tablet Take 1 tablet (500 mg total) by mouth 2 (two) times daily with a meal. Patient not taking: Reported on 09/10/2017 06/15/17   Georgina Quint, MD    No Known Allergies  Patient Active Problem List   Diagnosis Date Noted  . History of hyperglycemia 07/28/2017  . Elevated hemoglobin A1c 07/28/2017  . Essential hypertension 07/28/2017    Past Medical History:  Diagnosis Date  . Hypertension     No past surgical history on file.  Social History   Socioeconomic History  . Marital status: Married    Spouse name: Not on file  . Number of children: Not on file  . Years of education: Not on file  . Highest education level: Not on file  Social Needs  . Financial resource strain: Not on file  . Food insecurity - worry: Not on file  . Food insecurity - inability: Not on file  . Transportation needs - medical: Not on file  . Transportation needs - non-medical: Not on file  Occupational History  . Not on file  Tobacco Use  . Smoking status: Never Smoker  . Smokeless tobacco: Never Used  Substance and Sexual Activity  . Alcohol use: No  . Drug use: No  . Sexual activity: Not on file  Other Topics Concern  . Not on file  Social History Narrative  . Not on file    No family history on file.   Review of Systems    Constitutional: Negative.  Negative for chills and fever.  HENT: Negative.   Eyes: Negative.   Respiratory: Negative.  Negative for cough and shortness of breath.   Cardiovascular: Negative.  Negative for chest pain, palpitations and leg swelling.  Gastrointestinal: Negative for abdominal pain, blood in stool, diarrhea, melena, nausea and vomiting.       Has h/o GERD; sometimes has difficulty swallowing and voice hoarser at times.  Genitourinary: Negative.   Musculoskeletal: Negative.   Skin: Negative.  Negative for rash.  Neurological: Negative.   Endo/Heme/Allergies: Negative.   All other systems reviewed and are negative.  Vitals:   09/10/17 1358  BP: 130/78  Pulse: 86  Resp: 16  Temp: 98.4 F (36.9 C)  SpO2: 99%     Physical Exam  Constitutional: He is oriented to person, place, and time. He appears well-developed and well-nourished.  HENT:  Head: Normocephalic and atraumatic.  Right Ear: External ear normal.  Left Ear: External ear normal.  Nose: Nose normal.  Mouth/Throat: Oropharynx is clear and moist.  Eyes: Conjunctivae and EOM are normal. Pupils are equal, round, and reactive to light.  Neck: Normal range of motion. Neck supple. No JVD present. No thyromegaly present.  Cardiovascular: Normal rate, regular rhythm and normal heart sounds.  Pulmonary/Chest: Effort normal and breath sounds  normal.  Abdominal: Soft. Bowel sounds are normal. He exhibits no distension. There is no tenderness.  Musculoskeletal: Normal range of motion.  Lymphadenopathy:    He has no cervical adenopathy.  Neurological: He is alert and oriented to person, place, and time. No sensory deficit. He exhibits normal muscle tone.  Skin: Skin is warm and dry. Capillary refill takes less than 2 seconds. No rash noted.  Psychiatric: He has a normal mood and affect. His behavior is normal.  Vitals reviewed. EKG: NSR with VR69 No acute ischemic changes PR154 QRS90.   ASSESSMENT & PLAN: [name]  was seen today for chest pain.  Diagnoses and all orders for this visit:  Chest pain, unspecified type -     EKG 12-Lead  History of gastroesophageal reflux (GERD) -     Ambulatory referral to Gastroenterology    Patient Instructions       IF you received an x-ray today, you will receive an invoice from [facility]. Please contact [facility] at [phone] with questions or concerns regarding your invoice.   IF you received labwork today, you will receive an invoice from [facility]. Please contact [facility] at [phone] with questions or concerns regarding your invoice.   Our billing staff will not be able to assist you with questions regarding bills from these companies.  You will be contacted with the lab results as soon as they are available. The fastest way to get your results is to activate your My Chart account. Instructions are located on the last page of this paperwork. If you have not heard from us regarding the results in 2 weeks, please contact this office.     Enfermedad por reflujo gastroesofgico en los adultos (Gastroesophageal Reflux Disease, Adult) Normalmente, los alimentos descienden por el esfago y se depositan en el estmago para su digestin. Si una persona tiene enfermedad por reflujo gastroesofgico (ERGE), los alimentos y el cido estomacal regresan al esfago. Cuando esto ocurre, el esfago se irrita y se hincha (inflama). Con el tiempo, la ERGE puede provocar la formacin de pequeas perforaciones (lceras) en la mucosa del esfago. CUIDADOS EN EL HOGAR Dieta  Siga la dieta como se lo haya indicado el mdico. Tal vez deba evitar los siguientes alimentos y bebidas: ? Caf y t (con o sin cafena). ? Bebidas que contengan alcohol. ? Bebidas energizantes y deportivas. ? Gaseosas o refrescos. ? Chocolate y cacao. ? Menta y esencias de [address]a. ? Ajo y cebollas. ? Rbano picante. ? Alimentos muy condimentados y cidos, como  pimientos, [place] en polvo, curry en polvo, vinagre, salsas picantes y Engineer, watersalsa barbacoa. ? Frutas ctricas y sus jugos, como naranjas, limones y limas. ? Alimentos a base de tomates, como salsa roja, [place], salsa y pizza con salsa roja. ? Alimentos fritos y Lexicographergrasos, como rosquillas, papas fritas y aderezos con alto contenido de Holiday representativegrasa. ? Carnes con alto contenido de [place], como hot dogs, filetes de entrecot, salchicha, jamn y tocino. ? Productos lcteos con alto contenido de grasa, como [place] entera, [place]mantequilla y Leonqueso crema.  Consuma pequeas porciones de comida con ms frecuencia. Evite consumir porciones abundantes.  Evite beber mucho lquido con las comidas.  No coma durante las 2 o 3horas previas a la hora de [place]acostarse.  No se acueste inmediatamente despus de comer.  No haga actividad fsica enseguida despus de comer. Instrucciones generales  Est atento a cualquier cambio en los sntomas.  Tome los medicamentos de venta libre y los recetados solamente como se lo haya indicado el  mdico. No tome aspirina, ibuprofeno ni otros antiinflamatorios no esteroides (AINE), a menos que el mdico lo autorice.  No consuma ningn producto que contenga tabaco, lo que incluye cigarrillos, tabaco de Theatre manager y Administrator, Civil Service. Si necesita ayuda para dejar de fumar, consulte al mdico.  Use ropa suelta. No use nada ajustado alrededor [employer].  Levante (eleve) unas 6pulgadas (15centmetros) la cabecera de la cama.  Intente bajar el nivel de estrs. Si necesita ayuda para hacerlo, consulte al [employer].  Si tiene sobrepeso, Media planner un peso saludable. Pregntele a su mdico cmo puede perder peso de manera segura.  Concurra a todas las visitas de control como se lo haya indicado el mdico. Esto es importante. SOLICITE AYUDA SI:  Aparecen nuevos sntomas.  Baja de [place] y no sabe por qu.  Tiene dificultad para tragar o siente dolor al [employer].  Tiene sibilancias o  tos que no desaparece.  Los sntomas no mejoran con Scientist, research (medical).  Tiene la voz ronca. SOLICITE AYUDA DE INMEDIATO SI:  Tiene dolor en los brazos, el cuello, los [place], la dentadura o la espalda.  Berenice Primas, se marea o tiene sensacin de desvanecimiento.  Siente falta de aire o Journalist, newspaper.  Vomita y el vmito es parecido a la sangre o a los granos de caf.  Pierde el conocimiento (se desmaya).  Las heces son sanguinolentas o de color negro.  No puede tragar, beber o comer. Esta informacin no tiene Theme park manager el consejo del mdico. Asegrese de hacerle al mdico cualquier pregunta que tenga. Document Released: 09/19/2010 Document Revised: 05/08/2015 Document Reviewed: 12/12/2014 Elsevier Interactive Patient Education  2018 [employer]. Dolor de pecho inespecfico (Nonspecific Chest Pain) Suele ser difcil encontrar la causa del dolor de [place]. Siempre existe una posibilidad de que el dolor est relacionado con algo grave, como un infarto de miocardio o un cogulo sanguneo en los pulmones. Hay muchas enfermedades que no son potencialmente mortales que pueden causar dolor de [place]. Es importante que concurra a las visitas de control con el mdico. CUIDADOS EN EL HOGAR  Si le recetaron antibiticos, asegrese de terminarlos, incluso si comienza a Actor.  Evite las SUPER[employer] INC causen dolor de [place].  No consuma ningn producto que contenga tabaco, lo que incluye cigarrillos, tabaco de Theatre manager o Administrator, Civil Service. Si necesita ayuda para dejar de fumar, consulte al mdico.  No beba alcohol.  Tome los medicamentos solamente como se lo haya indicado el mdico.  Concurra a todas las visitas de control como se lo haya indicado el mdico. Esto es importante. Esto incluye otros estudios si el dolor de pecho no desaparece.  El mdico puede indicarle que mantenga la cabeza levantada (elevada) mientras duerme.  Haga cambios en su estilo de  vida segn las indicaciones del mdico. Estos pueden incluir lo siguiente: ? Practicar actividad fsica con regularidad. Pdale al mdico que le sugiera algunas actividades que sean seguras para usted. ? Consumir una dieta cardiosaludable. El mdico o un especialista en alimentacin (nutricionista) pueden ayudarlo a que haga elecciones saludables. ? Mantener un peso saludable. ? Controlar la diabetes, si es necesario. ? Reducir las situaciones de estrs.  SOLICITE AYUDA SI:  El dolor de pecho no desaparece, incluso despus del tratamiento.  Tiene una erupcin cutnea con ampollas en el pecho.  Tiene fiebre.  SOLICITE AYUDA DE INMEDIATO SI:  El dolor en el pecho es ms intenso.  La tos empeora, o expectora sangre.  Siente un dolor intenso en el  vientre (abdomen).  Se siente muy dbil.  Pierde el conocimiento (se desmaya).  Tiene escalofros.  Tiene una molestia repentina e inexplicable en el pecho.  Tiene molestias repentinas e [employer], la espalda, el cuello o la [place].  Le falta el aire en cualquier momento.  Comienza a sudar de [place] repentina o la piel se le humedece.  Siente nuseas.  Vomita.  Se siente repentinamente mareado o se desmaya.  Siente que el corazn comienza a latir rpidamente o que se saltea latidos. Estos sntomas pueden Customer service manager. No espere hasta que los sntomas desaparezcan. Solicite atencin mdica de inmediato. Comunquese con el servicio de emergencias de su localidad (911 en los [place]). No conduzca por sus propios medios [employer]. Esta informacin no tiene Theme park manager el consejo del mdico. Asegrese de hacerle al mdico cualquier pregunta que tenga. Document Released: 11/13/2008 Document Revised: 09/07/2014 Document Reviewed: 02/24/2016 Elsevier Interactive Patient Education  2017 Elsevier Inc.     Edwina Barth, MD [facility]

## 2017-09-10 NOTE — Patient Instructions (Addendum)
   IF you received an x-ray today, you will receive an invoice from [facility]. Please contact [facility] at [phone] with questions or concerns regarding your invoice.   IF you received labwork today, you will receive an invoice from [facility]. Please contact [facility] at [phone] with questions or concerns regarding your invoice.   Our billing staff will not be able to assist you with questions regarding bills from these companies.  You will be contacted with the lab results as soon as they are available. The fastest way to get your results is to activate your My Chart account. Instructions are located on the last page of this paperwork. If you have not heard from us regarding the results in 2 weeks, please contact this office.     Enfermedad por reflujo gastroesofgico en los adultos (Gastroesophageal Reflux Disease, Adult) Normalmente, los alimentos descienden por el esfago y se depositan en el estmago para su digestin. Si una persona tiene enfermedad por reflujo gastroesofgico (ERGE), los alimentos y el cido estomacal regresan al esfago. Cuando esto ocurre, el esfago se irrita y se hincha (inflama). Con el tiempo, la ERGE puede provocar la formacin de pequeas perforaciones (lceras) en la mucosa del esfago. CUIDADOS EN EL HOGAR Dieta  Siga la dieta como se lo haya indicado el mdico. Tal vez deba evitar los siguientes alimentos y bebidas: ? Caf y t (con o sin cafena). ? Bebidas que contengan alcohol. ? Bebidas energizantes y deportivas. ? Gaseosas o refrescos. ? Chocolate y cacao. ? Menta y esencias de menta. ? Ajo y cebollas. ? Rbano picante. ? Alimentos muy condimentados y cidos, como pimientos, chile en polvo, curry en polvo, vinagre, salsas picantes y salsa barbacoa. ? Frutas ctricas y sus jugos, como naranjas, limones y limas. ? Alimentos a base de tomates, como salsa roja, chile, salsa y pizza con salsa roja. ? Alimentos fritos y  grasos, como rosquillas, papas fritas y aderezos con alto contenido de grasa. ? Carnes con alto contenido de grasa, como hot dogs, filetes de entrecot, salchicha, jamn y tocino. ? Productos lcteos con alto contenido de grasa, como leche entera, mantequilla y queso crema.  Consuma pequeas porciones de comida con ms frecuencia. Evite consumir porciones abundantes.  Evite beber mucho lquido con las comidas.  No coma durante las 2 o 3horas previas a la hora de acostarse.  No se acueste inmediatamente despus de comer.  No haga actividad fsica enseguida despus de comer. Instrucciones generales  Est atento a cualquier cambio en los sntomas.  Tome los medicamentos de venta libre y los recetados solamente como se lo haya indicado el mdico. No tome aspirina, ibuprofeno ni otros antiinflamatorios no esteroides (AINE), a menos que el mdico lo autorice.  No consuma ningn producto que contenga tabaco, lo que incluye cigarrillos, tabaco de mascar y cigarrillos electrnicos. Si necesita ayuda para dejar de fumar, consulte al mdico.  Use ropa suelta. No use nada ajustado alrededor de la cintura.  Levante (eleve) unas 6pulgadas (15centmetros) la cabecera de la cama.  Intente bajar el nivel de estrs. Si necesita ayuda para hacerlo, consulte al mdico.  Si tiene sobrepeso, adelgace hasta alcanzar un peso saludable. Pregntele a su mdico cmo puede perder peso de manera segura.  Concurra a todas las visitas de control como se lo haya indicado el mdico. Esto es importante. SOLICITE AYUDA SI:  Aparecen nuevos sntomas.  Baja de peso y no sabe por qu.  Tiene dificultad para tragar o siente dolor al hacerlo.  Tiene   sibilancias o tos que no desaparece.  Los sntomas no mejoran con Scientist, research (medical)el tratamiento.  Tiene la voz ronca. SOLICITE AYUDA DE INMEDIATO SI:  Tiene dolor en los brazos, el cuello, los [place]il[place], la dentadura o la espalda.  Berenice Primasranspira, se marea o tiene sensacin de  desvanecimiento.  Siente falta de aire o Journalist, newspaperdolor en el pecho.  Vomita y el vmito es parecido a la sangre o a los granos de caf.  Pierde el conocimiento (se desmaya).  Las heces son sanguinolentas o de color negro.  No puede tragar, beber o comer. Esta informacin no tiene Theme park managercomo fin reemplazar el consejo del mdico. Asegrese de hacerle al mdico cualquier pregunta que tenga. Document Released: 09/19/2010 Document Revised: 05/08/2015 Document Reviewed: 12/12/2014 Elsevier Interactive Patient Education  2018 [employer]. Dolor de pecho inespecfico (Nonspecific Chest Pain) Suele ser difcil encontrar la causa del dolor de [place]. Siempre existe una posibilidad de que el dolor est relacionado con algo grave, como un infarto de miocardio o un cogulo sanguneo en los pulmones. Hay muchas enfermedades que no son potencialmente mortales que pueden causar dolor de [place]. Es importante que concurra a las visitas de control con el mdico. CUIDADOS EN EL HOGAR  Si le recetaron antibiticos, asegrese de terminarlos, incluso si comienza a Actorsentirse mejor.  Evite las SUPERVALU INCactividades que le causen dolor de [place].  No consuma ningn producto que contenga tabaco, lo que incluye cigarrillos, tabaco de Theatre managermascar o Administrator, Civil Servicecigarrillos electrnicos. Si necesita ayuda para dejar de fumar, consulte al mdico.  No beba alcohol.  Tome los medicamentos solamente como se lo haya indicado el mdico.  Concurra a todas las visitas de control como se lo haya indicado el mdico. Esto es importante. Esto incluye otros estudios si el dolor de pecho no desaparece.  El mdico puede indicarle que mantenga la cabeza levantada (elevada) mientras duerme.  Haga cambios en su estilo de vida segn las indicaciones del mdico. Estos pueden incluir lo siguiente: ? Practicar actividad fsica con regularidad. Pdale al mdico que le sugiera algunas actividades que sean seguras para usted. ? Consumir una dieta cardiosaludable. El mdico o un  especialista en alimentacin (nutricionista) pueden ayudarlo a que haga elecciones saludables. ? Mantener un peso saludable. ? Controlar la diabetes, si es necesario. ? Reducir las situaciones de estrs.  SOLICITE AYUDA SI:  El dolor de pecho no desaparece, incluso despus del tratamiento.  Tiene una erupcin cutnea con ampollas en el pecho.  Tiene fiebre.  SOLICITE AYUDA DE INMEDIATO SI:  El dolor en el pecho es ms intenso.  La tos empeora, o expectora sangre.  Siente un dolor intenso en el vientre (abdomen).  Se siente muy dbil.  Pierde el conocimiento (se desmaya).  Tiene escalofros.  Tiene una molestia repentina e inexplicable en el pecho.  Tiene molestias repentinas e Exxon Mobil Corporationinexplicables en los brazos, la espalda, el cuello o la [place].  Le falta el aire en cualquier momento.  Comienza a sudar de [place]manera repentina o la piel se le humedece.  Siente nuseas.  Vomita.  Se siente repentinamente mareado o se desmaya.  Siente que el corazn comienza a latir rpidamente o que se saltea latidos. Estos sntomas pueden Customer service managerindicar una emergencia. No espere hasta que los sntomas desaparezcan. Solicite atencin mdica de inmediato. Comunquese con el servicio de emergencias de su localidad (911 en los [place]). No conduzca por sus propios medios OfficeMax Incorporatedhasta el hospital. Esta informacin no tiene Theme park managercomo fin reemplazar el consejo del mdico. Asegrese de hacerle al mdico cualquier pregunta que tenga.  Document Released: 11/13/2008 Document Revised: 09/07/2014 Document Reviewed: 02/24/2016 Elsevier Interactive Patient Education  2017 [employer].

## 2017-09-10 NOTE — Progress Notes (Signed)
b

## 2017-09-16 ENCOUNTER — Encounter: Payer: Self-pay | Admitting: Gastroenterology

## 2017-09-24 ENCOUNTER — Ambulatory Visit: Payer: 59 | Admitting: Emergency Medicine

## 2017-10-26 ENCOUNTER — Ambulatory Visit: Payer: Self-pay | Admitting: Gastroenterology

## 2018-01-19 ENCOUNTER — Encounter: Payer: Self-pay | Admitting: Emergency Medicine

## 2018-07-05 ENCOUNTER — Encounter: Payer: Self-pay | Admitting: Emergency Medicine

## 2018-07-05 ENCOUNTER — Other Ambulatory Visit: Payer: Self-pay

## 2018-07-05 ENCOUNTER — Ambulatory Visit (INDEPENDENT_AMBULATORY_CARE_PROVIDER_SITE_OTHER): Payer: Managed Care, Other (non HMO) | Admitting: Emergency Medicine

## 2018-07-05 VITALS — BP 144/83 | HR 71 | Temp 97.9°F | Resp 18 | Ht 68.0 in | Wt 187.4 lb

## 2018-07-05 DIAGNOSIS — N529 Male erectile dysfunction, unspecified: Secondary | ICD-10-CM | POA: Diagnosis not present

## 2018-07-05 DIAGNOSIS — R0683 Snoring: Secondary | ICD-10-CM | POA: Diagnosis not present

## 2018-07-05 DIAGNOSIS — I10 Essential (primary) hypertension: Secondary | ICD-10-CM | POA: Diagnosis not present

## 2018-07-05 DIAGNOSIS — R400 Somnolence: Secondary | ICD-10-CM | POA: Diagnosis not present

## 2018-07-05 MED ORDER — TADALAFIL 20 MG PO TABS: 10.0000 mg | ORAL_TABLET | ORAL | 11 refills | Status: AC | PRN

## 2018-07-05 MED ORDER — OLMESARTAN MEDOXOMIL 20 MG PO TABS: 20.0000 mg | ORAL_TABLET | Freq: Every day | ORAL | 3 refills | Status: DC

## 2018-07-05 NOTE — Assessment & Plan Note (Signed)
Well-controlled.  Will change losartan to Benicar.

## 2018-07-05 NOTE — Patient Instructions (Addendum)
   If you have lab work done today you will be contacted with your lab results within the next 2 weeks.  If you have not heard from us then please contact us. The fastest way to get your results is to register for My Chart.   IF you received an x-ray today, you will receive an invoice from [facility]. Please contact [facility] at [phone] with questions or concerns regarding your invoice.   IF you received labwork today, you will receive an invoice from [facility]. Please contact [facility] at [phone] with questions or concerns regarding your invoice.   Our billing staff will not be able to assist you with questions regarding bills from these companies.  You will be contacted with the lab results as soon as they are available. The fastest way to get your results is to activate your My Chart account. Instructions are located on the last page of this paperwork. If you have not heard from us regarding the results in 2 weeks, please contact this office.     Hypertension Hypertension, commonly called high blood pressure, is when the force of blood pumping through the arteries is too strong. The arteries are the blood vessels that carry blood from the heart throughout the body. Hypertension forces the heart to work harder to pump blood and may cause arteries to become narrow or stiff. Having untreated or uncontrolled hypertension can cause heart attacks, strokes, kidney disease, and other problems. A blood pressure reading consists of a higher number over a lower number. Ideally, your blood pressure should be below 120/80. The first ("top") number is called the systolic pressure. It is a measure of the pressure in your arteries as your heart beats. The second ("bottom") number is called the diastolic pressure. It is a measure of the pressure in your arteries as the heart relaxes. What are the causes? The cause of this condition is not known. What increases the risk? Some  risk factors for high blood pressure are under your control. Others are not. Factors you can change  Smoking.  Having type 2 diabetes mellitus, high cholesterol, or both.  Not getting enough exercise or physical activity.  Being overweight.  Having too much fat, sugar, calories, or salt (sodium) in your diet.  Drinking too much alcohol. Factors that are difficult or impossible to change  Having chronic kidney disease.  Having a family history of high blood pressure.  Age. Risk increases with age.  Race. You may be at higher risk if you are African-American.  Gender. Men are at higher risk than women before age 45. After age 65, women are at higher risk than men.  Having obstructive sleep apnea.  Stress. What are the signs or symptoms? Extremely high blood pressure (hypertensive crisis) may cause:  Headache.  Anxiety.  Shortness of breath.  Nosebleed.  Nausea and vomiting.  Severe chest pain.  Jerky movements you cannot control (seizures).  How is this diagnosed? This condition is diagnosed by measuring your blood pressure while you are seated, with your arm resting on a surface. The cuff of the blood pressure monitor will be placed directly against the skin of your upper arm at the level of your heart. It should be measured at least twice using the same arm. Certain conditions can cause a difference in blood pressure between your right and left arms. Certain factors can cause blood pressure readings to be lower or higher than normal (elevated) for a short period of time:  When   your blood pressure is higher when you are in a health care provider's office than when you are at home, this is called white coat hypertension. Most people with this condition do not need medicines.  When your blood pressure is higher at home than when you are in a health care provider's office, this is called masked hypertension. Most people with this condition may need medicines to control  blood pressure.  If you have a high blood pressure reading during one visit or you have normal blood pressure with other risk factors:  You may be asked to return on a different day to have your blood pressure checked again.  You may be asked to monitor your blood pressure at home for 1 week or longer.  If you are diagnosed with hypertension, you may have other blood or imaging tests to help your health care provider understand your overall risk for other conditions. How is this treated? This condition is treated by making healthy lifestyle changes, such as eating healthy foods, exercising more, and reducing your alcohol intake. Your health care provider may prescribe medicine if lifestyle changes are not enough to get your blood pressure under control, and if:  Your systolic blood pressure is above 130.  Your diastolic blood pressure is above 80.  Your personal target blood pressure may vary depending on your medical conditions, your age, and other factors. Follow these instructions at home: Eating and drinking  Eat a diet that is high in fiber and potassium, and low in sodium, added sugar, and fat. An example eating plan is called the DASH (Dietary Approaches to Stop Hypertension) diet. To eat this way: ? Eat plenty of fresh fruits and vegetables. Try to fill half of your plate at each meal with fruits and vegetables. ? Eat whole grains, such as whole wheat pasta, brown rice, or whole grain bread. Fill about one quarter of your plate with whole grains. ? Eat or drink low-fat dairy products, such as skim milk or low-fat yogurt. ? Avoid fatty cuts of meat, processed or cured meats, and poultry with skin. Fill about one quarter of your plate with lean proteins, such as fish, chicken without skin, beans, eggs, and tofu. ? Avoid premade and processed foods. These tend to be higher in sodium, added sugar, and fat.  Reduce your daily sodium intake. Most people with hypertension should eat less  than 1,500 mg of sodium a day.  Limit alcohol intake to no more than 1 drink a day for nonpregnant women and 2 drinks a day for men. One drink equals 12 oz of beer, 5 oz of wine, or 1 oz of hard liquor. Lifestyle  Work with your health care provider to maintain a healthy body weight or to lose weight. Ask what an ideal weight is for you.  Get at least 30 minutes of exercise that causes your heart to beat faster (aerobic exercise) most days of the week. Activities may include walking, swimming, or biking.  Include exercise to strengthen your muscles (resistance exercise), such as pilates or lifting weights, as part of your weekly exercise routine. Try to do these types of exercises for 30 minutes at least 3 days a week.  Do not use any products that contain nicotine or tobacco, such as cigarettes and e-cigarettes. If you need help quitting, ask your health care provider.  Monitor your blood pressure at home as told by your health care provider.  Keep all follow-up visits as told by your health care provider.   This is important. Medicines  Take over-the-counter and prescription medicines only as told by your health care provider. Follow directions carefully. Blood pressure medicines must be taken as prescribed.  Do not skip doses of blood pressure medicine. Doing this puts you at risk for problems and can make the medicine less effective.  Ask your health care provider about side effects or reactions to medicines that you should watch for. Contact a health care provider if:  You think you are having a reaction to a medicine you are taking.  You have headaches that keep coming back (recurring).  You feel dizzy.  You have swelling in your ankles.  You have trouble with your vision. Get help right away if:  You develop a severe headache or confusion.  You have unusual weakness or numbness.  You feel faint.  You have severe pain in your chest or abdomen.  You vomit  repeatedly.  You have trouble breathing. Summary  Hypertension is when the force of blood pumping through your arteries is too strong. If this condition is not controlled, it may put you at risk for serious complications.  Your personal target blood pressure may vary depending on your medical conditions, your age, and other factors. For most people, a normal blood pressure is less than 120/80.  Hypertension is treated with lifestyle changes, medicines, or a combination of both. Lifestyle changes include weight loss, eating a healthy, low-sodium diet, exercising more, and limiting alcohol. This information is not intended to replace advice given to you by your health care provider. Make sure you discuss any questions you have with your health care provider. Document Released: 08/17/2005 Document Revised: 07/15/2016 Document Reviewed: 07/15/2016 Elsevier Interactive Patient Education  2018 [employer]  

## 2018-07-05 NOTE — Progress Notes (Signed)
Erik Mcgee 62 y.o.   Chief Complaint  Patient presents with  . Hypertension    follow up also like to discuss falling asleep while driving     HISTORY OF PRESENT ILLNESS: This is a 62 y.o. male with history of hypertension here for follow-up of possible change of medication.  Patient taking losartan and concerned about recall. Also concerned about multiple episodes of increased daytime sleepiness.  Snores.  Possible sleep apnea. Also complaining of erectile dysfunction.  Inquiring about medication. No other significant complaints or concerns.  HPI   Prior to Admission medications   Medication Sig Start Date End Date Taking? Authorizing Provider  losartan (COZAAR) 25 MG tablet Take 1 tablet (25 mg total) by mouth daily. 07/28/17 07/05/18 Yes Hanford Lust, Eilleen Kempf, MD  metFORMIN (GLUCOPHAGE) 500 MG tablet Take 1 tablet (500 mg total) by mouth 2 (two) times daily with a meal. Patient not taking: Reported on 09/10/2017 06/15/17   Georgina Quint, MD    No Known Allergies  Patient Active Problem List   Diagnosis Date Noted  . History of gastroesophageal reflux (GERD) 09/10/2017  . Chest pain 09/10/2017  . History of hyperglycemia 07/28/2017  . Elevated hemoglobin A1c 07/28/2017  . Essential hypertension 07/28/2017    Past Medical History:  Diagnosis Date  . Hypertension     No past surgical history on file.  Social History   Socioeconomic History  . Marital status: Married    Spouse name: Not on file  . Number of children: Not on file  . Years of education: Not on file  . Highest education level: Not on file  Occupational History  . Not on file  Social Needs  . Financial resource strain: Not on file  . Food insecurity:    Worry: Not on file    Inability: Not on file  . Transportation needs:    Medical: Not on file    Non-medical: Not on file  Tobacco Use  . Smoking status: Never Smoker  . Smokeless tobacco: Never Used  Substance and Sexual Activity    . Alcohol use: No  . Drug use: No  . Sexual activity: Not on file  Lifestyle  . Physical activity:    Days per week: Not on file    Minutes per session: Not on file  . Stress: Not on file  Relationships  . Social connections:    Talks on phone: Not on file    Gets together: Not on file    Attends religious service: Not on file    Active member of club or organization: Not on file    Attends meetings of clubs or organizations: Not on file    Relationship status: Not on file  . Intimate partner violence:    Fear of current or ex partner: Not on file    Emotionally abused: Not on file    Physically abused: Not on file    Forced sexual activity: Not on file  Other Topics Concern  . Not on file  Social History Narrative  . Not on file    No family history on file.   Review of Systems  Constitutional: Negative.  Negative for chills and fever.  HENT: Negative.  Negative for sore throat.   Eyes: Negative.  Negative for blurred vision and double vision.  Genitourinary:       ED  Musculoskeletal: Negative.   Skin: Negative.  Negative for rash.  Neurological: Negative.  Negative for dizziness and headaches.  Psychiatric/Behavioral:  Negative for depression. The patient is not nervous/anxious.    Vitals:   07/05/18 1538  BP: (!) 144/83  Pulse: 71  Resp: 18  Temp: 97.9 F (36.6 C)  SpO2: 98%     Physical Exam  Constitutional: He is oriented to person, place, and time. He appears well-developed and well-nourished.  HENT:  Head: Normocephalic and atraumatic.  Right Ear: External ear normal.  Left Ear: External ear normal.  Nose: Nose normal.  Mouth/Throat: Oropharynx is clear and moist.  Eyes: Pupils are equal, round, and reactive to light. Conjunctivae and EOM are normal.  Neck: Normal range of motion. Neck supple.  Cardiovascular: Normal rate, regular rhythm and normal heart sounds.  Pulmonary/Chest: Effort normal and breath sounds normal.  Musculoskeletal: Normal  range of motion.  Lymphadenopathy:    He has no cervical adenopathy.  Neurological: He is alert and oriented to person, place, and time. No sensory deficit. He exhibits normal muscle tone. Coordination normal.  Skin: Skin is warm and dry. Capillary refill takes less than 2 seconds.  Psychiatric: He has a normal mood and affect. His behavior is normal.  Vitals reviewed.  Essential hypertension Well-controlled.  Will change losartan to Benicar.    ASSESSMENT & PLAN: Erik Mcgee was seen today for hypertension.  Diagnoses and all orders for this visit:  Essential hypertension -     olmesartan (BENICAR) 20 MG tablet; Take 1 tablet (20 mg total) by mouth daily.  Daytime sleepiness Comments: Suspected sleep apnea Orders: -     Ambulatory referral to Neurology  Snoring -     Ambulatory referral to Neurology  Erectile dysfunction, unspecified erectile dysfunction type -     tadalafil (ADCIRCA/CIALIS) 20 MG tablet; Take 0.5-1 tablets (10-20 mg total) by mouth every other day as needed for erectile dysfunction.    Patient Instructions       If you have lab work done today you will be contacted with your lab results within the next 2 weeks.  If you have not heard from [place] then please contact us. The fastest way to get your results is to register for My Chart.   IF you received an x-ray today, you will receive an invoice from [facility]. Please contact [facility] at [phone] with questions or concerns regarding your invoice.   IF you received labwork today, you will receive an invoice from [facility]. Please contact [facility] at [phone] with questions or concerns regarding your invoice.   Our billing staff will not be able to assist you with questions regarding bills from these companies.  You will be contacted with the lab results as soon as they are available. The fastest way to get your results is to activate your My Chart account. Instructions are located  on the last page of this paperwork. If you have not heard from [place] regarding the results in 2 weeks, please contact this office.     Hypertension Hypertension, commonly called high blood pressure, is when the force of blood pumping through the arteries is too strong. The arteries are the blood vessels that carry blood from the heart throughout the body. Hypertension forces the heart to work harder to pump blood and may cause arteries to become narrow or stiff. Having untreated or uncontrolled hypertension can cause heart attacks, strokes, kidney disease, and other problems. A blood pressure reading consists of a higher number over a lower number. Ideally, your blood pressure should be below 120/80. The first ("top") number is called the systolic pressure. It  is a measure of the pressure in your arteries as your heart beats. The second ("bottom") number is called the diastolic pressure. It is a measure of the pressure in your arteries as the heart relaxes. What are the causes? The cause of this condition is not known. What increases the risk? Some risk factors for high blood pressure are under your control. Others are not. Factors you can change  Smoking.  Having type 2 diabetes mellitus, high cholesterol, or both.  Not getting enough exercise or physical activity.  Being overweight.  Having too much fat, sugar, calories, or salt (sodium) in your diet.  Drinking too much alcohol. Factors that are difficult or impossible to change  Having chronic kidney disease.  Having a family history of high blood pressure.  Age. Risk increases with age.  Race. You may be at higher risk if you are African-American.  Gender. Men are at higher risk than women before age 52. After age 49, women are at higher risk than men.  Having obstructive sleep apnea.  Stress. What are the signs or symptoms? Extremely high blood pressure (hypertensive crisis) may cause:  Headache.  Anxiety.  Shortness  of breath.  Nosebleed.  Nausea and vomiting.  Severe chest pain.  Jerky movements you cannot control (seizures).  How is this diagnosed? This condition is diagnosed by measuring your blood pressure while you are seated, with your arm resting on a surface. The cuff of the blood pressure monitor will be placed directly against the skin of your upper arm at the level of your heart. It should be measured at least twice using the same arm. Certain conditions can cause a difference in blood pressure between your right and left arms. Certain factors can cause blood pressure readings to be lower or higher than normal (elevated) for a short period of time:  When your blood pressure is higher when you are in a health care provider's office than when you are at home, this is called white coat hypertension. Most people with this condition do not need medicines.  When your blood pressure is higher at home than when you are in a health care provider's office, this is called masked hypertension. Most people with this condition may need medicines to control blood pressure.  If you have a high blood pressure reading during one visit or you have normal blood pressure with other risk factors:  You may be asked to return on a different day to have your blood pressure checked again.  You may be asked to monitor your blood pressure at home for 1 week or longer.  If you are diagnosed with hypertension, you may have other blood or imaging tests to help your health care provider understand your overall risk for other conditions. How is this treated? This condition is treated by making healthy lifestyle changes, such as eating healthy foods, exercising more, and reducing your alcohol intake. Your health care provider may prescribe medicine if lifestyle changes are not enough to get your blood pressure under control, and if:  Your systolic blood pressure is above 130.  Your diastolic blood pressure is above  80.  Your personal target blood pressure may vary depending on your medical conditions, your age, and other factors. Follow these instructions at home: Eating and drinking  Eat a diet that is high in fiber and potassium, and low in sodium, added sugar, and fat. An example eating plan is called the DASH (Dietary Approaches to Stop Hypertension) diet. To eat this  way: ? Eat plenty of fresh fruits and vegetables. Try to fill half of your plate at each meal with fruits and vegetables. ? Eat whole grains, such as whole wheat pasta, brown rice, or whole grain bread. Fill about one quarter of your plate with whole grains. ? Eat or drink low-fat dairy products, such as skim milk or low-fat yogurt. ? Avoid fatty cuts of meat, processed or cured meats, and poultry with skin. Fill about one quarter of your plate with lean proteins, such as fish, chicken without skin, beans, eggs, and tofu. ? Avoid premade and processed foods. These tend to be higher in sodium, added sugar, and fat.  Reduce your daily sodium intake. Most people with hypertension should eat less than 1,500 mg of sodium a day.  Limit alcohol intake to no more than 1 drink a day for nonpregnant women and 2 drinks a day for men. One drink equals 12 oz of beer, 5 oz of wine, or 1 oz of hard liquor. Lifestyle  Work with your health care provider to maintain a healthy body weight or to lose weight. Ask what an ideal weight is for you.  Get at least 30 minutes of exercise that causes your heart to beat faster (aerobic exercise) most days of the week. Activities may include walking, swimming, or biking.  Include exercise to strengthen your muscles (resistance exercise), such as pilates or lifting weights, as part of your weekly exercise routine. Try to do these types of exercises for 30 minutes at least 3 days a week.  Do not use any products that contain nicotine or tobacco, such as cigarettes and e-cigarettes. If you need help quitting, ask  your health care provider.  Monitor your blood pressure at home as told by your health care provider.  Keep all follow-up visits as told by your health care provider. This is important. Medicines  Take over-the-counter and prescription medicines only as told by your health care provider. Follow directions carefully. Blood pressure medicines must be taken as prescribed.  Do not skip doses of blood pressure medicine. Doing this puts you at risk for problems and can make the medicine less effective.  Ask your health care provider about side effects or reactions to medicines that you should watch for. Contact a health care provider if:  You think you are having a reaction to a medicine you are taking.  You have headaches that keep coming back (recurring).  You feel dizzy.  You have swelling in your ankles.  You have trouble with your vision. Get help right away if:  You develop a severe headache or confusion.  You have unusual weakness or numbness.  You feel faint.  You have severe pain in your chest or abdomen.  You vomit repeatedly.  You have trouble breathing. Summary  Hypertension is when the force of blood pumping through your arteries is too strong. If this condition is not controlled, it may put you at risk for serious complications.  Your personal target blood pressure may vary depending on your medical conditions, your age, and other factors. For most people, a normal blood pressure is less than 120/80.  Hypertension is treated with lifestyle changes, medicines, or a combination of both. Lifestyle changes include weight loss, eating a healthy, low-sodium diet, exercising more, and limiting alcohol. This information is not intended to replace advice given to you by your health care provider. Make sure you discuss any questions you have with your health care provider. Document Released: 08/17/2005 Document  Revised: 07/15/2016 Document Reviewed: 07/15/2016 Elsevier  Interactive Patient Education  2018 [employer].      Edwina Barth, MD [facility]

## 2018-07-29 ENCOUNTER — Encounter: Payer: Managed Care, Other (non HMO) | Admitting: Emergency Medicine

## 2018-09-01 ENCOUNTER — Encounter

## 2018-09-01 ENCOUNTER — Other Ambulatory Visit: Payer: Self-pay

## 2018-09-01 ENCOUNTER — Encounter: Payer: Self-pay | Admitting: Emergency Medicine

## 2018-09-01 ENCOUNTER — Ambulatory Visit (INDEPENDENT_AMBULATORY_CARE_PROVIDER_SITE_OTHER): Payer: Managed Care, Other (non HMO) | Admitting: Emergency Medicine

## 2018-09-01 VITALS — BP 128/83 | HR 72 | Temp 98.2°F | Resp 16 | Ht 68.25 in | Wt 185.2 lb

## 2018-09-01 DIAGNOSIS — R7309 Other abnormal glucose: Secondary | ICD-10-CM

## 2018-09-01 DIAGNOSIS — I10 Essential (primary) hypertension: Secondary | ICD-10-CM

## 2018-09-01 DIAGNOSIS — Z0001 Encounter for general adult medical examination with abnormal findings: Secondary | ICD-10-CM

## 2018-09-01 DIAGNOSIS — Z130 Encounter for screening for diseases of the blood and blood-forming organs and certain disorders involving the immune mechanism: Secondary | ICD-10-CM | POA: Diagnosis not present

## 2018-09-01 DIAGNOSIS — Z13220 Encounter for screening for lipoid disorders: Secondary | ICD-10-CM

## 2018-09-01 DIAGNOSIS — R739 Hyperglycemia, unspecified: Secondary | ICD-10-CM

## 2018-09-01 DIAGNOSIS — Z0000 Encounter for general adult medical examination without abnormal findings: Secondary | ICD-10-CM

## 2018-09-01 DIAGNOSIS — Z13228 Encounter for screening for other metabolic disorders: Secondary | ICD-10-CM

## 2018-09-01 DIAGNOSIS — Z8639 Personal history of other endocrine, nutritional and metabolic disease: Secondary | ICD-10-CM

## 2018-09-01 DIAGNOSIS — Z1329 Encounter for screening for other suspected endocrine disorder: Secondary | ICD-10-CM

## 2018-09-01 MED ORDER — AMLODIPINE BESYLATE 5 MG PO TABS: 5.0000 mg | ORAL_TABLET | Freq: Every day | ORAL | 3 refills | Status: DC

## 2018-09-01 NOTE — Assessment & Plan Note (Signed)
Well-controlled but due to medication erectile dysfunction side effects will change to amlodipine 5 mg a day.

## 2018-09-01 NOTE — Patient Instructions (Addendum)

## 2018-09-01 NOTE — Progress Notes (Addendum)
Erik Mcgee 63 y.o.   Chief Complaint  Patient presents with  . Annual Exam    HISTORY OF PRESENT ILLNESS: This is a 63 y.o. male here for annual exam.  Has a history of hypertension, presently taking Benicar, but is causing erection dysfunction.  Requesting change of medication.  HPI   Prior to Admission medications   Medication Sig Start Date End Date Taking? Authorizing Provider  olmesartan (BENICAR) 20 MG tablet Take 1 tablet (20 mg total) by mouth daily. 07/05/18 10/03/18 Yes Erik Mcgee, Erik Kempf, MD  metFORMIN (GLUCOPHAGE) 500 MG tablet Take 1 tablet (500 mg total) by mouth 2 (two) times daily with a meal. Patient not taking: Reported on 09/01/2018 06/15/17   Erik Quint, MD  tadalafil (ADCIRCA/CIALIS) 20 MG tablet Take 0.5-1 tablets (10-20 mg total) by mouth every other day as needed for erectile dysfunction. Patient not taking: Reported on 09/01/2018 07/05/18   Erik Quint, MD    No Known Allergies  Patient Active Problem List   Diagnosis Date Noted  . Daytime sleepiness 07/05/2018  . Snoring 07/05/2018  . Erectile dysfunction 07/05/2018  . History of gastroesophageal reflux (GERD) 09/10/2017  . Chest pain 09/10/2017  . History of hyperglycemia 07/28/2017  . Elevated hemoglobin A1c 07/28/2017  . Essential hypertension 07/28/2017    Past Medical History:  Diagnosis Date  . Hypertension     No past surgical history on file.  Social History   Socioeconomic History  . Marital status: Married    Spouse name: Not on file  . Number of children: Not on file  . Years of education: Not on file  . Highest education level: Not on file  Occupational History  . Not on file  Social Needs  . Financial resource strain: Not on file  . Food insecurity:    Worry: Not on file    Inability: Not on file  . Transportation needs:    Medical: Not on file    Non-medical: Not on file  Tobacco Use  . Smoking status: Never Smoker  . Smokeless tobacco: Never  Used  Substance and Sexual Activity  . Alcohol use: No  . Drug use: No  . Sexual activity: Not on file  Lifestyle  . Physical activity:    Days per week: Not on file    Minutes per session: Not on file  . Stress: Not on file  Relationships  . Social connections:    Talks on phone: Not on file    Gets together: Not on file    Attends religious service: Not on file    Active member of club or organization: Not on file    Attends meetings of clubs or organizations: Not on file    Relationship status: Not on file  . Intimate partner violence:    Fear of current or ex partner: Not on file    Emotionally abused: Not on file    Physically abused: Not on file    Forced sexual activity: Not on file  Other Topics Concern  . Not on file  Social History Narrative  . Not on file    No family history on file.   Review of Systems  Constitutional: Negative.  Negative for chills and fever.  HENT: Negative.  Negative for hearing loss and sore throat.   Eyes: Negative.  Negative for blurred vision and double vision.  Respiratory: Negative.  Negative for cough and shortness of breath.   Cardiovascular: Negative.  Negative for chest pain  and palpitations.  Gastrointestinal: Negative.  Negative for abdominal pain, blood in stool, melena, nausea and vomiting.  Genitourinary: Negative.  Negative for dysuria.  Musculoskeletal: Negative.  Negative for back pain, myalgias and neck pain.  Skin: Negative.  Negative for rash.  Neurological: Negative.  Negative for dizziness and headaches.  Endo/Heme/Allergies: Negative.   All other systems reviewed and are negative.   Vitals:   09/01/18 1017  BP: 128/83  Pulse: 72  Resp: 16  Temp: 98.2 F (36.8 C)  SpO2: 100%    Physical Exam Vitals signs reviewed.  Constitutional:      Appearance: Normal appearance.  HENT:     Head: Normocephalic and atraumatic.     Nose: Nose normal.     Mouth/Throat:     Mouth: Mucous membranes are moist.    Eyes:     Conjunctiva/sclera: Conjunctivae normal.     Pupils: Pupils are equal, round, and reactive to light.  Neck:     Musculoskeletal: Normal range of motion and neck supple.     Vascular: No carotid bruit.  Cardiovascular:     Rate and Rhythm: Normal rate and regular rhythm.     Pulses: Normal pulses.     Heart sounds: Normal heart sounds.  Pulmonary:     Effort: Pulmonary effort is normal.     Breath sounds: Normal breath sounds.  Abdominal:     General: There is no distension.     Palpations: Abdomen is soft. There is no mass.     Tenderness: There is no abdominal tenderness.  Musculoskeletal: Normal range of motion.  Lymphadenopathy:     Cervical: No cervical adenopathy.  Skin:    General: Skin is warm and dry.  Neurological:     General: No focal deficit present.     Mental Status: He is alert and oriented to person, place, and time.     Sensory: No sensory deficit.     Motor: No weakness.  Psychiatric:        Mood and Affect: Mood normal.        Behavior: Behavior normal.   The 10-year ASCVD risk score Denman George(Goff DC Jr., et al., 2013) is: 8.7%   Values used to calculate the score:     Age: [age] years     Sex: Male     Is Non-Hispanic African American: No     Diabetic: No     Tobacco smoker: No     Systolic Blood Pressure: 128 mmHg     Is BP treated: Yes     HDL Cholesterol: 60 mg/dL     Total Cholesterol: 179 mg/dL    ASSESSMENT & PLAN: Essential hypertension Well-controlled but due to medication erectile dysfunction side effects will change to amlodipine 5 mg a day.  Erik Mcgee was seen today for annual exam.  Diagnoses and all orders for this visit:  Routine general medical examination at a health care facility -     Comprehensive metabolic panel -     PSA(Must document that pt has been informed of limitations of PSA testing.)  Essential hypertension -     amLODipine (NORVASC) 5 MG tablet; Take 1 tablet (5 mg total) by mouth daily.  Screening for  deficiency anemia -     CBC with Differential  Screening for lipoid disorders -     Lipid panel  Screening for endocrine, metabolic and immunity disorder -     Comprehensive metabolic panel -     Hemoglobin A1c -  Lipid panel  History of hyperglycemia -     Hemoglobin A1c    Patient Instructions       If you have lab work done today you will be contacted with your lab results within the next 2 weeks.  If you have not heard from [place] then please contact us. The fastest way to get your results is to register for My Chart.   IF you received an x-ray today, you will receive an invoice from [facility]. Please contact [facility] at [phone] with questions or concerns regarding your invoice.   IF you received labwork today, you will receive an invoice from [facility]. Please contact [facility] at [phone] with questions or concerns regarding your invoice.   Our billing staff will not be able to assist you with questions regarding bills from these companies.  You will be contacted with the lab results as soon as they are available. The fastest way to get your results is to activate your My Chart account. Instructions are located on the last page of this paperwork. If you have not heard from [place] regarding the results in 2 weeks, please contact this office.      Health Maintenance, Male A healthy lifestyle and preventive care is important for your health and wellness. Ask your health care provider about what schedule of regular examinations is right for you. What should I know about weight and diet? Eat a Healthy Diet  Eat plenty of vegetables, fruits, whole grains, low-fat dairy products, and lean protein.  Do not eat a lot of foods high in solid fats, added sugars, or salt.  Maintain a Healthy Weight Regular exercise can help you achieve or maintain a healthy weight. You should:  Do at least 150 minutes of exercise each week. The exercise should  increase your heart rate and make you sweat (moderate-intensity exercise).  Do strength-training exercises at least twice a week. Watch Your Levels of Cholesterol and Blood Lipids  Have your blood tested for lipids and cholesterol every 5 years starting at 62 years of age. If you are at high risk for heart disease, you should start having your blood tested when you are 62 years old. You may need to have your cholesterol levels checked more often if: ? Your lipid or cholesterol levels are high. ? You are older than 62 years of age. ? You are at high risk for heart disease. What should I know about cancer screening? Many types of cancers can be detected early and may often be prevented. Lung Cancer  You should be screened every year for lung cancer if: ? You are a current smoker who has smoked for at least 30 years. ? You are a former smoker who has quit within the past 15 years.  Talk to your health care provider about your screening options, when you should start screening, and how often you should be screened. Colorectal Cancer  Routine colorectal cancer screening usually begins at 62 years of age and should be repeated every 5-10 years until you are 62 years old. You may need to be screened more often if early forms of precancerous polyps or small growths are found. Your health care provider may recommend screening at an earlier age if you have risk factors for colon cancer.  Your health care provider may recommend using home test kits to check for hidden blood in the stool.  A small camera at the end of a tube can be used to examine your colon (sigmoidoscopy  or colonoscopy). This checks for the earliest forms of colorectal cancer. Prostate and Testicular Cancer  Depending on your age and overall health, your health care provider may do certain tests to screen for prostate and testicular cancer.  Talk to your health care provider about any symptoms or concerns you have about testicular  or prostate cancer. Skin Cancer  Check your skin from head to toe regularly.  Tell your health care provider about any new moles or changes in moles, especially if: ? There is a change in a mole's size, shape, or color. ? You have a mole that is larger than a pencil eraser.  Always use sunscreen. Apply sunscreen liberally and repeat throughout the day.  Protect yourself by wearing long sleeves, pants, a wide-brimmed hat, and sunglasses when outside. What should I know about heart disease, diabetes, and high blood pressure?  If you are 72-15 years of age, have your blood pressure checked every 3-5 years. If you are 28 years of age or older, have your blood pressure checked every year. You should have your blood pressure measured twice-once when you are at a hospital or clinic, and once when you are not at a hospital or clinic. Record the average of the two measurements. To check your blood pressure when you are not at a hospital or clinic, you can use: ? An automated blood pressure machine at a pharmacy. ? A home blood pressure monitor.  Talk to your health care provider about your target blood pressure.  If you are between 58-26 years old, ask your health care provider if you should take aspirin to prevent heart disease.  Have regular diabetes screenings by checking your fasting blood sugar level. ? If you are at a normal weight and have a low risk for diabetes, have this test once every three years after the age of 68. ? If you are overweight and have a high risk for diabetes, consider being tested at a younger age or more often.  A one-time screening for abdominal aortic aneurysm (AAA) by ultrasound is recommended for men aged 65-75 years who are current or former smokers. What should I know about preventing infection? Hepatitis B If you have a higher risk for hepatitis B, you should be screened for this virus. Talk with your health care provider to find out if you are at risk for  hepatitis B infection. Hepatitis C Blood testing is recommended for:  Everyone born from [birth year] through 1964.  Anyone with known risk factors for hepatitis C. Sexually Transmitted Diseases (STDs)  You should be screened each year for STDs including gonorrhea and chlamydia if: ? You are sexually active and are younger than 62 years of age. ? You are older than 62 years of age and your health care provider tells you that you are at risk for this type of infection. ? Your sexual activity has changed since you were last screened and you are at an increased risk for chlamydia or gonorrhea. Ask your health care provider if you are at risk.  Talk with your health care provider about whether you are at high risk of being infected with HIV. Your health care provider may recommend a prescription medicine to help prevent HIV infection. What else can I do?  Schedule regular health, dental, and eye exams.  Stay current with your vaccines (immunizations).  Do not use any tobacco products, such as cigarettes, chewing tobacco, and e-cigarettes. If you need help quitting, ask your health care provider.  Limit  alcohol intake to no more than 2 drinks per day. One drink equals 12 ounces of beer, 5 ounces of wine, or 1 ounces of hard liquor.  Do not use street drugs.  Do not share needles.  Ask your health care provider for help if you need support or information about quitting drugs.  Tell your health care provider if you often feel depressed.  Tell your health care provider if you have ever been abused or do not feel safe at home. This information is not intended to replace advice given to you by your health care provider. Make sure you discuss any questions you have with your health care provider. Document Released: 02/13/2008 Document Revised: 04/15/2016 Document Reviewed: 05/21/2015 Elsevier Interactive Patient Education  2019 [employer]      Edwina Barth, MD [facility]

## 2018-09-02 LAB — CBC WITH DIFFERENTIAL/PLATELET
BASOS: 1 %
Basophils Absolute: 0 10*3/uL (ref 0.0–0.2)
EOS (ABSOLUTE): 0.1 10*3/uL (ref 0.0–0.4)
Eos: 1 %
Hematocrit: 38.6 % (ref 37.5–51.0)
Hemoglobin: 12.7 g/dL — ABNORMAL LOW (ref 13.0–17.7)
Immature Grans (Abs): 0 10*3/uL (ref 0.0–0.1)
Immature Granulocytes: 0 %
LYMPHS ABS: 1.9 10*3/uL (ref 0.7–3.1)
Lymphs: 37 %
MCH: 27.5 pg (ref 26.6–33.0)
MCHC: 32.9 g/dL (ref 31.5–35.7)
MCV: 84 fL (ref 79–97)
MONOS ABS: 0.5 10*3/uL (ref 0.1–0.9)
Monocytes: 11 %
NEUTROS ABS: 2.6 10*3/uL (ref 1.4–7.0)
NEUTROS PCT: 50 %
PLATELETS: 238 10*3/uL (ref 150–450)
RBC: 4.61 x10E6/uL (ref 4.14–5.80)
RDW: 14.3 % (ref 12.3–15.4)
WBC: 5 10*3/uL (ref 3.4–10.8)

## 2018-09-02 LAB — COMPREHENSIVE METABOLIC PANEL
A/G RATIO: 1.9 (ref 1.2–2.2)
ALT: 31 IU/L (ref 0–44)
AST: 23 IU/L (ref 0–40)
Albumin: 4.6 g/dL (ref 3.6–4.8)
Alkaline Phosphatase: 80 IU/L (ref 39–117)
BILIRUBIN TOTAL: 0.6 mg/dL (ref 0.0–1.2)
BUN/Creatinine Ratio: 20 (ref 10–24)
BUN: 13 mg/dL (ref 8–27)
CHLORIDE: 100 mmol/L (ref 96–106)
CO2: 27 mmol/L (ref 20–29)
Calcium: 9.7 mg/dL (ref 8.6–10.2)
Creatinine, Ser: 0.65 mg/dL — ABNORMAL LOW (ref 0.76–1.27)
GFR calc non Af Amer: 105 mL/min/{1.73_m2} (ref >59)
GFR, EST AFRICAN AMERICAN: 121 mL/min/{1.73_m2} (ref >59)
GLOBULIN, TOTAL: 2.4 g/dL (ref 1.5–4.5)
Glucose: 121 mg/dL — ABNORMAL HIGH (ref 65–99)
POTASSIUM: 4.6 mmol/L (ref 3.5–5.2)
SODIUM: 139 mmol/L (ref 134–144)
Total Protein: 7 g/dL (ref 6.0–8.5)

## 2018-09-02 LAB — LIPID PANEL
CHOLESTEROL TOTAL: 179 mg/dL (ref 100–199)
Chol/HDL Ratio: 3 ratio (ref 0.0–5.0)
HDL: 60 mg/dL (ref >39)
LDL CALC: 103 mg/dL — AB (ref 0–99)
Triglycerides: 78 mg/dL (ref 0–149)
VLDL Cholesterol Cal: 16 mg/dL (ref 5–40)

## 2018-09-02 LAB — PSA: PROSTATE SPECIFIC AG, SERUM: 0.9 ng/mL (ref 0.0–4.0)

## 2018-09-02 LAB — HEMOGLOBIN A1C
ESTIMATED AVERAGE GLUCOSE: 151 mg/dL
Hgb A1c MFr Bld: 6.9 % — ABNORMAL HIGH (ref 4.8–5.6)

## 2018-09-05 MED ORDER — ROSUVASTATIN CALCIUM 5 MG PO TABS: 5.0000 mg | ORAL_TABLET | Freq: Every day | ORAL | 3 refills | Status: DC

## 2018-09-05 MED ORDER — METFORMIN HCL 500 MG PO TABS: 500.0000 mg | ORAL_TABLET | Freq: Two times a day (BID) | ORAL | 3 refills | Status: DC

## 2018-09-05 NOTE — Addendum Note (Signed)
Addended by: Evie Lacks on: 09/05/2018 06:14 PM   Modules accepted: Orders

## 2018-09-15 ENCOUNTER — Ambulatory Visit: Payer: Managed Care, Other (non HMO) | Admitting: Neurology

## 2018-09-15 ENCOUNTER — Encounter: Payer: Self-pay | Admitting: Neurology

## 2018-09-15 VITALS — BP 136/85 | HR 86 | Ht 69.0 in | Wt 184.0 lb

## 2018-09-15 DIAGNOSIS — E663 Overweight: Secondary | ICD-10-CM | POA: Diagnosis not present

## 2018-09-15 DIAGNOSIS — G4719 Other hypersomnia: Secondary | ICD-10-CM | POA: Diagnosis not present

## 2018-09-15 DIAGNOSIS — R351 Nocturia: Secondary | ICD-10-CM | POA: Diagnosis not present

## 2018-09-15 DIAGNOSIS — R0683 Snoring: Secondary | ICD-10-CM | POA: Diagnosis not present

## 2018-09-15 DIAGNOSIS — R400 Somnolence: Secondary | ICD-10-CM

## 2018-09-15 NOTE — Patient Instructions (Signed)

## 2018-09-15 NOTE — Progress Notes (Signed)
Subjective:    Patient ID: Apolinar Guirguis is a 63 y.o. male.  HPI     Huston Foley, MD, PhD [facility] [address], [address] [address] [place], [place] [address]   Dear Dr. Alvy Bimler,  I saw your patient, Yianni Vroman, upon your kind request in my sleep clinic today for initial consultation of his sleep disorder, in particular, concern for underlying obstructive sleep apnea. The patient is unaccompanied today. As you know, Mr. Puebla is a 63 year old right-handed gentleman with an underlying medical history of prediabetes, hypertension, reflux disease, ED and overweight state, who reports snoring and excessive daytime somnolence. I reviewed your office note from 07/05/2018. His Epworth sleepiness score is 9/24, fatigue score is 10/63. He denies a FHx of OSA, no recurrent HAs, but does have nocturia 1-2 per night, father and 2 younger sisters snore.  He lives with his wife, has 2 daughters, has one dog, not in the bedroom, has a TV in the BR, watches it for about 1 hour at night, turns it off. Has had some difficulty maintaining sleep, but no trouble falling asleep, wife snores, which can be bothersome to him. No tell-tale RLS symptoms. He has had some sleepiness at the wheel, at the stoplight or highway. He has been sleepy when going home, which is only a 12 drive.  He has never had a sleep study, but willing to get tested. He has a bedtime typically of 10:30 or 11 and rise time of 5. He works as a Archivist. He reports having had excessive sleepiness for the past 3 years or so. Some 3 years ago he was able to lose weight. He is a nonsmoker and does not typically utilize any alcohol, caffeine limitation, about 1 cup of coffee per day on average. He tries to hydrate well with water, estimates that he drinks about 3-4 bottles of water per day.  His Past Medical History Is Significant For: Past Medical History:  Diagnosis Date  . Hypertension     His Past Surgical  History Is Significant For: No past surgical history on file.  His Family History Is Significant For: Family History  Problem Relation Age of Onset  . Cancer Mother        melamona  . Heart disease Father     His Social History Is Significant For: Social History   Socioeconomic History  . Marital status: Married    Spouse name: Not on file  . Number of children: Not on file  . Years of education: Not on file  . Highest education level: Not on file  Occupational History  . Not on file  Social Needs  . Financial resource strain: Not on file  . Food insecurity:    Worry: Not on file    Inability: Not on file  . Transportation needs:    Medical: Not on file    Non-medical: Not on file  Tobacco Use  . Smoking status: Never Smoker  . Smokeless tobacco: Never Used  Substance and Sexual Activity  . Alcohol use: No  . Drug use: No  . Sexual activity: Not on file  Lifestyle  . Physical activity:    Days per week: Not on file    Minutes per session: Not on file  . Stress: Not on file  Relationships  . Social connections:    Talks on phone: Not on file    Gets together: Not on file    Attends religious service: Not on file  Active member of club or organization: Not on file    Attends meetings of clubs or organizations: Not on file    Relationship status: Not on file  Other Topics Concern  . Not on file  Social History Narrative  . Not on file    His Allergies Are:  No Known Allergies:   His Current Medications Are:  Outpatient Encounter Medications as of 09/15/2018  Medication Sig  . amLODipine (NORVASC) 5 MG tablet Take 1 tablet (5 mg total) by mouth daily.  . metFORMIN (GLUCOPHAGE) 500 MG tablet Take 1 tablet (500 mg total) by mouth 2 (two) times daily with a meal. (Patient not taking: Reported on 09/15/2018)  . olmesartan (BENICAR) 20 MG tablet Take 1 tablet (20 mg total) by mouth daily. (Patient not taking: Reported on 09/15/2018)  . rosuvastatin (CRESTOR) 5  MG tablet Take 1 tablet (5 mg total) by mouth daily. (Patient not taking: Reported on 09/15/2018)  . tadalafil (ADCIRCA/CIALIS) 20 MG tablet Take 0.5-1 tablets (10-20 mg total) by mouth every other day as needed for erectile dysfunction. (Patient not taking: Reported on 09/01/2018)   No facility-administered encounter medications on file as of 09/15/2018.   :  Review of Systems:  Out of a complete 14 point review of systems, all are reviewed and negative with the exception of these symptoms as listed below:  Review of Systems  Neurological:       Pt presents today to discuss his sleep. Pt has never had a sleep study but does endorse snoring.  Epworth Sleepiness Scale 0= would never doze 1= slight chance of dozing 2= moderate chance of dozing 3= high chance of dozing  Sitting and reading: Watching TV: Sitting inactive in a public place (ex. Theater or meeting): As a passenger in a car for an hour without a break: Lying down to rest in the afternoon: Sitting and talking to someone: Sitting quietly after lunch (no alcohol): In a car, while stopped in traffic: Total:     Objective:  Neurological Exam  Physical Exam Physical Examination:   Vitals:   09/15/18 1333  BP: 136/85  Pulse: 86    General Examination: The patient is a very pleasant 63 y.o. male in no acute distress. He appears well-developed and well-nourished and well groomed.   HEENT: Normocephalic, atraumatic, pupils are equal, round and reactive to light and accommodation. Extraocular tracking is good without limitation to gaze excursion or nystagmus noted. Normal smooth pursuit is noted. Hearing is grossly intact. Face is symmetric with normal facial animation and normal facial sensation. Speech is clear but mildly hoarse. There is no lip, neck/head, jaw or voice tremor. Neck is supple with full range of passive and active motion. There are no carotid bruits on auscultation. Oropharynx exam reveals: moderate mouth  dryness, adequate dental hygiene and moderate airway crowding, due to large uvula, redundant soft palate. Mallampati is class II. Tongue protrudes centrally and palate elevates symmetrically. Tonsils are small in size. Neck size is 16 7/8 inches. He has a Moderate overbite.    Chest: Clear to auscultation without wheezing, rhonchi or crackles noted.  Heart: S1+S2+0, regular and normal without murmurs, rubs or gallops noted.   Abdomen: Soft, non-tender and non-distended with normal bowel sounds appreciated on auscultation.  Extremities: There is no pitting edema in the distal lower extremities bilaterally.   Skin: Warm and dry without trophic changes noted.   Musculoskeletal: exam reveals no obvious joint deformities, tenderness or joint swelling or erythema.   Neurologically:  Mental status: The patient is awake, alert and oriented in all 4 spheres. His immediate and remote memory, attention, language skills and fund of knowledge are appropriate. There is no evidence of aphasia, agnosia, apraxia or anomia. Speech is clear with normal prosody and enunciation. Thought process is linear. Mood is normal and affect is normal.  Cranial nerves II - XII are as described above under HEENT exam. In addition: shoulder shrug is normal with equal shoulder height noted. Motor exam: Normal bulk, strength and tone is noted. There is no drift, tremor or rebound. Romberg is negative. Fine motor skills and coordination: intact with normal finger taps, normal hand movements, normal rapid alternating patting, normal foot taps and normal foot agility.  Cerebellar testing: No dysmetria or intention tremor on finger to nose testing. Heel to shin is unremarkable bilaterally. There is no truncal or gait ataxia.  Sensory exam: intact to light touch in the upper and lower extremities.  Gait, station and balance: He stands easily. No veering to one side is noted. No leaning to one side is noted. Posture is age-appropriate  and stance is narrow based. Gait shows normal stride length and normal pace. No problems turning are noted. Tandem walk is unremarkable.   Assessment and Plan:  In summary, Shayne AlkenWilson Vaccaro is a very pleasant 63 y.o.-year old male with an underlying medical history of prediabetes, hypertension, reflux disease, ED and overweight state, whose history and physical exam are concerning for obstructive sleep apnea (OSA). I had a long chat with the patient about my findings and the diagnosis of OSA, its prognosis and treatment options. We talked about medical treatments, surgical interventions and non-pharmacological approaches. I explained in particular the risks and ramifications of untreated moderate to severe OSA, especially with respect to developing cardiovascular disease down the Road, including congestive heart failure, difficult to treat hypertension, cardiac arrhythmias, or stroke. Even type 2 diabetes has, in part, been linked to untreated OSA. Symptoms of untreated OSA include daytime sleepiness, memory problems, mood irritability and mood disorder such as depression and anxiety, lack of energy, as well as recurrent headaches, especially morning headaches. We talked about trying to maintain a healthy lifestyle in general, as well as the importance of weight control. I encouraged the patient to eat healthy, exercise daily and keep well hydrated, to keep a scheduled bedtime and wake time routine, to not skip any meals and eat healthy snacks in between meals. I advised the patient not to drive when feeling sleepy. I recommended the following at this time: sleep study with potential positive airway pressure titration. (We will score hypopneas at 4%).   I explained the sleep test procedure to the patient and also outlined possible surgical and non-surgical treatment options of OSA, including the use of a custom-made dental device (which would require a referral to a specialist dentist or oral surgeon), upper  airway surgical options, such as pillar implants, radiofrequency surgery, tongue base surgery, and UPPP (which would involve a referral to an ENT surgeon). Rarely, jaw surgery such as mandibular advancement may be considered.  I also explained the CPAP treatment option to the patient, who indicated that he would be willing to try CPAP if the need arises. I explained the importance of being compliant with PAP treatment, not only for insurance purposes but primarily to improve His symptoms, and for the patient's long term health benefit, including to reduce His cardiovascular risks. I answered all his questions today and the patient was in agreement. I plan to see  him back after the sleep study is completed and encouraged him to call with any interim questions, concerns, problems or updates.   Thank you very much for allowing me to participate in the care of this nice patient. If I can be of any further assistance to you please do not hesitate to call me at [phone].  Sincerely,   Huston FoleySaima Maor Meckel, MD, PhD

## 2018-09-15 NOTE — Progress Notes (Signed)
Epworth Sleepiness Scale 0= would never doze 1= slight chance of dozing 2= moderate chance of dozing 3= high chance of dozing  Sitting and reading: 0 Watching TV: 1 Sitting inactive in a public place (ex. Theater or meeting): 2 As a passenger in a car for an hour without a break: 2 Lying down to rest in the afternoon: 0 Sitting and talking to someone: 0 Sitting quietly after lunch (no alcohol): 1 In a car, while stopped in traffic: 3 Total: 9

## 2018-11-09 ENCOUNTER — Ambulatory Visit (INDEPENDENT_AMBULATORY_CARE_PROVIDER_SITE_OTHER): Payer: Managed Care, Other (non HMO) | Admitting: Neurology

## 2018-11-09 ENCOUNTER — Other Ambulatory Visit: Payer: Self-pay

## 2018-11-09 DIAGNOSIS — R0683 Snoring: Secondary | ICD-10-CM

## 2018-11-09 DIAGNOSIS — G4710 Hypersomnia, unspecified: Secondary | ICD-10-CM

## 2018-11-09 DIAGNOSIS — G4719 Other hypersomnia: Secondary | ICD-10-CM

## 2018-11-09 DIAGNOSIS — R400 Somnolence: Secondary | ICD-10-CM

## 2018-11-09 DIAGNOSIS — R351 Nocturia: Secondary | ICD-10-CM

## 2018-11-09 DIAGNOSIS — E663 Overweight: Secondary | ICD-10-CM

## 2018-11-14 ENCOUNTER — Telehealth: Payer: Self-pay

## 2018-11-14 NOTE — Telephone Encounter (Signed)
I called pt. I advised pt that Dr. Frances Furbish reviewed pt's sleep study and found that pt did not show any significant osa, some snoring was noted, mild osa in REM sleep. Dr. Frances Furbish recommends that pt avoid sleeping on his back and pursue weight loss and to follow up with his PCP. I reviewed sleep hygiene recommendations with the pt, including trying to keep a regular sleep wake schedule, avoiding electronics in the bedroom, keeping the bedroom cool, dark, and quiet, and avoiding eating or exercising within 2 hours of bedtime as well as eating in the middle of the night. I advised pt to keep pets out of the bedroom. I discussed with pt the importance of stress relief and to try meditation, deep breathing exercises, and/or a white noise machine or fan to diffuse other noise distractors. I advised pt to not drink alcohol before bedtime and to never mix alcohol and sedating medications. Pt was advised to avoid narcotic pain medication close to bedtime. I advised pt that a copy of these sleep study results will be sent to Dr. Alvy Bimler. Pt asked me to mail a copy of his sleep study results to him and I verified that the address we have on file is correct. Pt verbalized understanding of results. Pt had no questions at this time but was encouraged to call back if questions arise.

## 2018-11-14 NOTE — Progress Notes (Signed)
Patient referred by Dr. Alvy Bimler, seen by me on 09/15/18, HST on 11/09/18.   Please call and notify the patient that the recent home sleep test did not show any significant obstructive sleep apnea. Some snoring was noted, for which avoiding the supine sleep position and some weight loss will likely suffice as treatment. Mild OSA in dream sleep but treatment with CPAP is not warranted based on this test.  Patient can follow up with the referring provider. Please remind patient to try to maintain good sleep hygiene, which means: Keep a regular sleep and wake schedule and make enough time for sleep (7 1/2 to 8 1/2 hours for the average adult), try not to exercise or have a meal within 2 hours of your bedtime, try to keep your bedroom conducive for sleep, that is, cool and dark, without light distractors such as an illuminated alarm clock, and refrain from watching TV right before sleep or in the middle of the night and do not keep the TV or radio on during the night. If a nightlight is used, have it away from the visual field. Also, try not to use or play on electronic devices at bedtime, such as your cell phone, tablet PC or laptop. If you like to read at bedtime on an electronic device, try to dim the background light as much as possible. Do not eat in the middle of the night. Keep pets away from the bedroom environment. For stress relief, try meditation, deep breathing exercises (there are many books and CDs available), a white noise machine or fan can help to diffuse other noise distractors, such as traffic noise. Do not drink alcohol before bedtime, as it can disturb sleep and cause middle of the night awakenings. Never mix alcohol and sedating medications! Avoid narcotic pain medication close to bedtime, as opioids/narcotics can suppress breathing drive and breathing effort.    Thanks,  Huston Foley, MD, PhD [facility])

## 2018-11-14 NOTE — Procedures (Signed)
[facility] [address] [place], [place] [address] NAME:  Erik Mcgee                                                                       DOB: 1956/04/04 MEDICAL RECORD no:  [id number]                                               DOS: 11/09/2018 REFERRING PHYSICIAN: Edwina Barth, MD STUDY PERFORMED: [facility] on [employer] HISTORY: 63 year old man with a history of prediabetes, hypertension, reflux disease, and overweight state, who reports snoring and excessive daytime somnolence. His Epworth sleepiness score is 9/24, BMI of 27.4. STUDY RESULTS:   Total Recording Time: 7 hrs,12 mins; Total Sleep Time: 6 hrs, 4 mins Total Apnea/Hypopnea Index (AHI):  3.8/h; RDI:  7.0/h; REM AHI: 8.1/h Average Oxygen Saturation:  98 %; Lowest Oxygen Desaturation:  88 %  Total Time Oxygen Saturation Below or at 88%: 0.1 minutes  Average Heart Rate:  76 bpm (between 47 and 115 bpm) IMPRESSION: Snoring RECOMMENDATION: This home sleep test does not demonstrate any significant obstructive or central sleep disordered breathing. Snoring was noted and appeared to be more in the supine position. He has some evidence of mild REM sleep related OSA. Positive airway therapy is not warranted. He will be advised to sleep off his back. Other causes of the patient's symptoms, including circadian rhythm disturbances, an underlying mood disorder, medication effect and/or an underlying medical problem cannot be ruled out based on this test. Clinical correlation is recommended. The patient should be cautioned not to drive, work at heights, or operate dangerous or heavy equipment when tired or sleepy. Review and reiteration of good sleep hygiene measures should be pursued with any patient. The patient can follow up with his referring provider, who will be notified of the test results. An appointment in sleep clinic can be made as necessary.   I certify that I have reviewed the raw data recording prior  to the issuance of this report in accordance with the standards of the American Academy of Sleep Medicine (AASM).  Huston Foley, MD, PhD [facility]) Diplomat, ABPN (Neurology and Sleep)

## 2018-11-14 NOTE — Telephone Encounter (Signed)
Pt sleep study mailed out on 11/14/18

## 2018-11-14 NOTE — Telephone Encounter (Signed)
-----   Message from Huston Foley, MD sent at 11/14/2018  8:55 AM EDT ----- Patient referred by Dr. Alvy Bimler, seen by me on 09/15/18, HST on 11/09/18.   Please call and notify the patient that the recent home sleep test did not show any significant obstructive sleep apnea. Some snoring was noted, for which avoiding the supine sleep position and some weight loss will likely suffice as treatment. Mild OSA in dream sleep but treatment with CPAP is not warranted based on this test.  Patient can follow up with the referring provider. Please remind patient to try to maintain good sleep hygiene, which means: Keep a regular sleep and wake schedule and make enough time for sleep (7 1/2 to 8 1/2 hours for the average adult), try not to exercise or have a meal within 2 hours of your bedtime, try to keep your bedroom conducive for sleep, that is, cool and dark, without light distractors such as an illuminated alarm clock, and refrain from watching TV right before sleep or in the middle of the night and do not keep the TV or radio on during the night. If a nightlight is used, have it away from the visual field. Also, try not to use or play on electronic devices at bedtime, such as your cell phone, tablet PC or laptop. If you like to read at bedtime on an electronic device, try to dim the background light as much as possible. Do not eat in the middle of the night. Keep pets away from the bedroom environment. For stress relief, try meditation, deep breathing exercises (there are many books and CDs available), a white noise machine or fan can help to diffuse other noise distractors, such as traffic noise. Do not drink alcohol before bedtime, as it can disturb sleep and cause middle of the night awakenings. Never mix alcohol and sedating medications! Avoid narcotic pain medication close to bedtime, as opioids/narcotics can suppress breathing drive and breathing effort.    Thanks,  Huston Foley, MD, PhD [facility])

## 2019-03-06 ENCOUNTER — Telehealth: Payer: Self-pay | Admitting: Emergency Medicine

## 2019-03-06 NOTE — Telephone Encounter (Signed)
Patient would like to speak to provider about Cialis medication [phone]

## 2019-03-07 NOTE — Telephone Encounter (Signed)
After confirming with the provider I have called and informed pt that it is ok to use the Dynamo-delay with the cialis, he verbalized understanding.

## 2019-03-22 ENCOUNTER — Ambulatory Visit (INDEPENDENT_AMBULATORY_CARE_PROVIDER_SITE_OTHER): Payer: 59 | Admitting: Emergency Medicine

## 2019-03-22 ENCOUNTER — Encounter: Payer: Self-pay | Admitting: Emergency Medicine

## 2019-03-22 ENCOUNTER — Other Ambulatory Visit: Payer: Self-pay

## 2019-03-22 VITALS — BP 129/76 | HR 89 | Temp 98.3°F | Resp 16 | Ht 69.0 in | Wt 189.0 lb

## 2019-03-22 DIAGNOSIS — Z20828 Contact with and (suspected) exposure to other viral communicable diseases: Secondary | ICD-10-CM | POA: Diagnosis not present

## 2019-03-22 DIAGNOSIS — I10 Essential (primary) hypertension: Secondary | ICD-10-CM

## 2019-03-22 DIAGNOSIS — R351 Nocturia: Secondary | ICD-10-CM | POA: Diagnosis not present

## 2019-03-22 DIAGNOSIS — Z20822 Contact with and (suspected) exposure to covid-19: Secondary | ICD-10-CM

## 2019-03-22 MED ORDER — AMLODIPINE BESYLATE 5 MG PO TABS: 5.0000 mg | ORAL_TABLET | Freq: Every day | ORAL | 3 refills | Status: DC

## 2019-03-22 NOTE — Progress Notes (Addendum)
Erik Mcgee 63 y.o.   Chief Complaint  Patient presents with  . Hypertension    folllow up 6 months  . Medication Refill    Amlodipine   Lab Results  Component Value Date   HGBA1C 6.9 (H) 09/01/2018     HISTORY OF PRESENT ILLNESS: This is a 62 y.o. male with history of hypertension here for follow-up and medication refill.  Doing well.  Takes amlodipine 5 mg daily.  Has no complaints or medical concerns today. 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  Workers encouraged to get tested.  Inquiring about testing here. HPI   Prior to Admission medications   Medication Sig Start Date End Date Taking? Authorizing Provider  amLODipine (NORVASC) 5 MG tablet Take 1 tablet (5 mg total) by mouth daily. 09/01/18  Yes Erik Mcgee, Erik Bloomer, MD  tadalafil (ADCIRCA/CIALIS) 20 MG tablet Take 0.5-1 tablets (10-20 mg total) by mouth every other day as needed for erectile dysfunction. 07/05/18  Yes Erik Mcgee, Erik Bloomer, MD  metFORMIN (GLUCOPHAGE) 500 MG tablet Take 1 tablet (500 mg total) by mouth 2 (two) times daily with a meal. Patient not taking: Reported on 03/22/2019 09/05/18   Erik Pollen, MD  olmesartan (BENICAR) 20 MG tablet Take 1 tablet (20 mg total) by mouth daily. Patient not taking: Reported on 09/15/2018 07/05/18 10/03/18  Erik Pollen, MD  rosuvastatin (CRESTOR) 5 MG tablet Take 1 tablet (5 mg total) by mouth daily. Patient not taking: Reported on 03/22/2019 09/05/18   Erik Pollen, MD    No Known Allergies  Patient Active Problem List   Diagnosis Date Noted  . Daytime sleepiness 07/05/2018  . Snoring 07/05/2018  . Erectile dysfunction 07/05/2018  . History of gastroesophageal reflux (GERD) 09/10/2017  . Chest pain 09/10/2017  . History of hyperglycemia 07/28/2017  . Elevated hemoglobin A1c 07/28/2017  . Essential hypertension 07/28/2017    Past Medical History:  Diagnosis Date  . Hypertension     No past surgical history on file.   Social History   Socioeconomic History  . Marital status: Married    Spouse name: Not on file  . Number of children: Not on file  . Years of education: Not on file  . Highest education level: Not on file  Occupational History  . Not on file  Social Needs  . Financial resource strain: Not on file  . Food insecurity    Worry: Not on file    Inability: Not on file  . Transportation needs    Medical: Not on file    Non-medical: Not on file  Tobacco Use  . Smoking status: Never Smoker  . Smokeless tobacco: Never Used  Substance and Sexual Activity  . Alcohol use: No  . Drug use: No  . Sexual activity: Not on file  Lifestyle  . Physical activity    Days per week: Not on file    Minutes per session: Not on file  . Stress: Not on file  Relationships  . Social Herbalist on phone: Not on file    Gets together: Not on file    Attends religious service: Not on file    Active member of club or organization: Not on file    Attends meetings of clubs or organizations: Not on file    Relationship status: Not on file  . Intimate partner violence    Fear of current or ex partner: Not on file    Emotionally abused: Not  on file    Physically abused: Not on file    Forced sexual activity: Not on file  Other Topics Concern  . Not on file  Social History Narrative  . Not on file    Family History  Problem Relation Age of Onset  . Cancer Mother        melamona  . Heart disease Father      Review of Systems  Constitutional: Negative.  Negative for chills and fever.  HENT: Negative.  Negative for congestion and sore throat.   Eyes: Negative.   Respiratory: Negative.  Negative for cough and shortness of breath.   Cardiovascular: Negative.  Negative for chest pain and palpitations.  Gastrointestinal: Negative.  Negative for abdominal pain, diarrhea, nausea and vomiting.  Genitourinary: Negative.  Negative for dysuria.       Nocturia   Musculoskeletal: Negative.   Negative for myalgias and neck pain.  Skin: Negative.  Negative for rash.  Neurological: Negative.  Negative for dizziness and headaches.  Endo/Heme/Allergies: Negative.   All other systems reviewed and are negative.  Vitals:   03/22/19 1555  BP: 129/76  Pulse: 89  Resp: 16  Temp: 98.3 F (36.8 C)  SpO2: 98%     Physical Exam Vitals signs reviewed.  Constitutional:      Appearance: Normal appearance.  HENT:     Head: Normocephalic and atraumatic.  Eyes:     Extraocular Movements: Extraocular movements intact.     Conjunctiva/sclera: Conjunctivae normal.     Pupils: Pupils are equal, round, and reactive to light.  Cardiovascular:     Rate and Rhythm: Normal rate and regular rhythm.     Pulses: Normal pulses.     Heart sounds: Normal heart sounds.  Pulmonary:     Effort: Pulmonary effort is normal.     Breath sounds: Normal breath sounds.  Musculoskeletal: Normal range of motion.  Skin:    General: Skin is warm and dry.     Capillary Refill: Capillary refill takes less than 2 seconds.  Neurological:     General: No focal deficit present.     Mental Status: He is alert and oriented to person, place, and time.      ASSESSMENT & PLAN: Erik Mcgee was seen today for hypertension and medication refill.  Diagnoses and all orders for this visit:  Essential hypertension -     Comprehensive metabolic panel -     CBC with Differential/Platelet -     Hemoglobin A1c -     Lipid panel -     amLODipine (NORVASC) 5 MG tablet; Take 1 tablet (5 mg total) by mouth daily.  Exposure to Covid-19 Virus   Patient was tested for COVID in the office.   Patient Instructions       If you have lab work done today you will be contacted with your lab results within the next 2 weeks.  If you have not heard from us then please contact us. The fastest way to get your results is to register for My Chart.   IF you received an x-ray today, you will receive an [facility]. Please contact [facility] at [phone] with questions or concerns regarding your invoice.   IF you received labwork today, you will receive an invoice from [facility]. Please contact [facility] at [phone] with questions or concerns regarding your invoice.   Our billing staff will not be able to assist you with questions regarding bills from these companies.  You  will be contacted with the lab results as soon as they are available. The fastest way to get your results is to activate your My Chart account. Instructions are located on the last page of this paperwork. If you have not heard from us regarding the results in 2 weeks, please contact this office.     Hipertensin en los adultos Hypertension, Adult El trmino hipertensin es otra forma de denominar a la presin arterial elevada. La presin arterial elevada fuerza al corazn a trabajar ms para bombear la sangre. Esto puede causar problemas con el paso del Chill[place]tiempo. Una lectura de presin arterial est compuesta por 2 nmeros. Hay un nmero superior (sistlico) sobre un nmero inferior (diastlico). Lo ideal es tener la presin arterial por debajo de 120/80. Las elecciones saludables pueden ayudar a Personal assistantbajar la presin arterial, o tal vez necesite medicamentos para bajarla. Cules son las causas? Se desconoce la causa de esta afeccin. Algunas afecciones pueden estar relacionadas con la presin arterial alta. Qu incrementa el riesgo?  Fumar.  Tener diabetes mellitus tipo 2, colesterol alto, o ambos.  No hacer la cantidad suficiente de actividad fsica o ejercicio.  Tener sobrepeso.  Consumir mucha grasa, azcar, caloras o sal (sodio) en su dieta.  Beber alcohol en exceso.  Tener una enfermedad renal a largo plazo (crnica).  Tener antecedentes familiares de presin arterial alta.  Edad. Los riesgos aumentan con la edad.  Raza. El riesgo es mayor para las Statisticianpersonas afroamericanas.  Sexo. Antes de los  45aos, los hombres corren ms Goodyear Tireriesgo que las mujeres. Despus de los 65aos, las mujeres corren ms Lexmark Internationalriesgo que los hombres.  Tener apnea obstructiva del sueo.  Estrs. Cules son los signos o los sntomas?  Es posible que la presin arterial alta puede no cause sntomas. La presin arterial muy alta (crisis hipertensiva) puede provocar: ? Dolor de Turkmenistancabeza. ? Sensaciones de preocupacin o nerviosismo (ansiedad). ? Falta de aire. ? Hemorragia nasal. ? Sensacin de Journalist, newspapermalestar en el estmago (nuseas). ? Vmitos. ? Cambios en la forma de ver. ? Dolor muy intenso en el pecho. ? Convulsiones. Cmo se trata?  Esta afeccin se trata haciendo cambios saludables en el estilo de vida, por ejemplo: ? Consumir alimentos saludables. ? Hacer ms ejercicio. ? Beber menos alcohol.  El mdico puede recetarle medicamentos si los cambios en el estilo de vida no son suficientes para Museum/gallery curatorlograr controlar la presin arterial y si: ? El nmero de arriba est por encima de 130. ? El nmero de abajo est por encima de 80.  Su presin arterial personal ideal puede variar. Siga estas instrucciones en su casa: Comida y bebida   Si se lo dicen, siga el plan de alimentacin de DASH (Dietary Approaches to Stop Hypertension, Maneras de alimentarse para detener la hipertensin). Para seguir este plan: ? Llene la mitad del plato de cada comida con frutas y verduras. ? Llene un cuarto del plato de cada comida con cereales integrales. Los cereales integrales incluyen pasta integral, arroz integral y pan integral. ? Coma y beba productos lcteos con bajo contenido de grasa, como leche descremada o yogur bajo en grasas. ? Llene un cuarto del plato de cada comida con protenas bajas en grasa (magras). Las protenas bajas en grasa incluyen pescado, pollo sin piel, huevos, frijoles y tofu. ? Evite consumir carne grasa, carne curada y procesada, o pollo con piel. ? Evite consumir alimentos prehechos o procesados.   Consuma menos de 1500 mg de sal por da.  No beba alcohol si: ?  El mdico le indica que no lo haga. ? Est embarazada, puede estar embarazada o est tratando de quedar embarazada.  Si bebe alcohol: ? Limite la cantidad que bebe a lo siguiente:  De 0 a 1 medida por da para las mujeres.  De 0 a 2 medidas por da para los hombres. ? Est atento a la cantidad de alcohol que hay en las bebidas que toma. En los [place], una medida equivale a una botella de cerveza de 12oz (355ml), un vaso de vino de 5oz (148ml) o un vaso de una bebida alcohlica de alta graduacin de 1oz (44ml). Estilo de vida   Trabaje con su mdico para mantenerse en un peso saludable o para perder peso. Pregntele a su mdico cul es el peso recomendable para usted.  Haga al menos 30minutos de ejercicio la DIRECTVmayora de los das de la [place]semana. Estos pueden incluir caminar, nadar o andar en bicicleta.  Realice al menos 30 minutos de ejercicio que fortalezca sus msculos (ejercicios de resistencia) al menos 3 das a la Western Gro[place]emana. Estos pueden incluir levantar pesas o hacer Pilates.  No consuma ningn producto que contenga nicotina o tabaco, como cigarrillos, cigarrillos electrnicos y tabaco de Theatre managermascar. Si necesita ayuda para dejar de fumar, consulte al American Expressmdico.  Controle su presin arterial en su casa tal como le indic el mdico.  Concurra a todas las visitas de seguimiento como se lo haya indicado el mdico. Esto es importante. Medicamentos  Baxter Internationalome los medicamentos de venta libre y los recetados solamente como se lo haya indicado el mdico. Siga cuidadosamente las indicaciones.  No omita las dosis de medicamentos para la presin arterial. Los medicamentos pierden eficacia si omite dosis. El hecho de omitir las dosis tambin Lesothoaumenta el riesgo de otros problemas.  Pregntele a su mdico a qu efectos secundarios o reacciones a los Museum/gallery curatormedicamentos debe prestar atencin. Comunquese con un mdico si:  Piensa que tiene  [place] reaccin a los medicamentos que est tomando.  Tiene dolores de cabeza frecuentes (recurrentes).  Se siente mareado.  Tiene hinchazn en los tobillos.  Tiene problemas de visin. Solicite ayuda inmediatamente si:  Siente un dolor de cabeza muy intenso.  Empieza a sentirse desorientado (confundido).  Se siente dbil o adormecido.  Siente que va a desmayarse.  Tiene un dolor muy intenso en las siguientes zonas: ? Pecho. ? Vientre (abdomen).  Vomita ms de una vez.  Tiene dificultad para respirar. Resumen  El trmino hipertensin es otra forma de denominar a la presin arterial elevada.  La presin arterial elevada fuerza al corazn a trabajar ms para bombear la sangre.  Para la Franklin Resourcesmayora de las personas, una presin arterial normal es menor que 120/80.  Las decisiones saludables pueden ayudarle a disminuir su presin arterial. Si no puede bajar su presin arterial mediante decisiones saludables, es posible que deba tomar medicamentos. Esta informacin no tiene Theme park managercomo fin reemplazar el consejo del mdico. Asegrese de hacerle al mdico cualquier pregunta que tenga. Document Released: 02/04/2010 Document Revised: 06/02/2018 Document Reviewed: 06/02/2018 Elsevier Patient Education  2020 Elsevier Inc.      Edwina BarthMiguel Renika Shiflet, MD [facility]

## 2019-03-22 NOTE — Addendum Note (Signed)
Addended by: Davina Poke on: 03/22/2019 05:13 PM   Modules accepted: Orders

## 2019-03-22 NOTE — Patient Instructions (Addendum)
   If you have lab work done today you will be contacted with your lab results within the next 2 weeks.  If you have not heard from us then please contact us. The fastest way to get your results is to register for My Chart.   IF you received an x-ray today, you will receive an invoice from [facility]. Please contact [facility] at [phone] with questions or concerns regarding your invoice.   IF you received labwork today, you will receive an invoice from [facility]. Please contact [facility] at [phone] with questions or concerns regarding your invoice.   Our billing staff will not be able to assist you with questions regarding bills from these companies.  You will be contacted with the lab results as soon as they are available. The fastest way to get your results is to activate your My Chart account. Instructions are located on the last page of this paperwork. If you have not heard from us regarding the results in 2 weeks, please contact this office.     Hipertensin en los adultos Hypertension, Adult El trmino hipertensin es otra forma de denominar a la presin arterial elevada. La presin arterial elevada fuerza al corazn a trabajar ms para bombear la sangre. Esto puede causar problemas con el paso del tiempo. Una lectura de presin arterial est compuesta por 2 nmeros. Hay un nmero superior (sistlico) sobre un nmero inferior (diastlico). Lo ideal es tener la presin arterial por debajo de 120/80. Las elecciones saludables pueden ayudar a bajar la presin arterial, o tal vez necesite medicamentos para bajarla. Cules son las causas? Se desconoce la causa de esta afeccin. Algunas afecciones pueden estar relacionadas con la presin arterial alta. Qu incrementa el riesgo?  Fumar.  Tener diabetes mellitus tipo 2, colesterol alto, o ambos.  No hacer la cantidad suficiente de actividad fsica o ejercicio.  Tener sobrepeso.  Consumir mucha grasa,  azcar, caloras o sal (sodio) en su dieta.  Beber alcohol en exceso.  Tener una enfermedad renal a largo plazo (crnica).  Tener antecedentes familiares de presin arterial alta.  Edad. Los riesgos aumentan con la edad.  Raza. El riesgo es mayor para las personas afroamericanas.  Sexo. Antes de los 45aos, los hombres corren ms riesgo que las mujeres. Despus de los 65aos, las mujeres corren ms riesgo que los hombres.  Tener apnea obstructiva del sueo.  Estrs. Cules son los signos o los sntomas?  Es posible que la presin arterial alta puede no cause sntomas. La presin arterial muy alta (crisis hipertensiva) puede provocar: ? Dolor de cabeza. ? Sensaciones de preocupacin o nerviosismo (ansiedad). ? Falta de aire. ? Hemorragia nasal. ? Sensacin de malestar en el estmago (nuseas). ? Vmitos. ? Cambios en la forma de ver. ? Dolor muy intenso en el pecho. ? Convulsiones. Cmo se trata?  Esta afeccin se trata haciendo cambios saludables en el estilo de vida, por ejemplo: ? Consumir alimentos saludables. ? Hacer ms ejercicio. ? Beber menos alcohol.  El mdico puede recetarle medicamentos si los cambios en el estilo de vida no son suficientes para lograr controlar la presin arterial y si: ? El nmero de arriba est por encima de 130. ? El nmero de abajo est por encima de 80.  Su presin arterial personal ideal puede variar. Siga estas instrucciones en su casa: Comida y bebida   Si se lo dicen, siga el plan de alimentacin de DASH (Dietary Approaches to Stop Hypertension, Maneras de alimentarse para detener la hipertensin). Para   seguir este plan: ? Llene la mitad del plato de cada comida con frutas y verduras. ? Llene un cuarto del plato de cada comida con cereales integrales. Los cereales integrales incluyen pasta integral, arroz integral y pan integral. ? Coma y beba productos lcteos con bajo contenido de grasa, como leche descremada o yogur bajo en  grasas. ? Llene un cuarto del plato de cada comida con protenas bajas en grasa (magras). Las protenas bajas en grasa incluyen pescado, pollo sin piel, huevos, frijoles y tofu. ? Evite consumir carne grasa, carne curada y procesada, o pollo con piel. ? Evite consumir alimentos prehechos o procesados.  Consuma menos de 1500 mg de sal por da.  No beba alcohol si: ? El mdico le indica que no lo haga. ? Est embarazada, puede estar embarazada o est tratando de quedar embarazada.  Si bebe alcohol: ? Limite la cantidad que bebe a lo siguiente:  De 0 a 1 medida por da para las mujeres.  De 0 a 2 medidas por da para los hombres. ? Est atento a la cantidad de alcohol que hay en las bebidas que toma. En los [place], una medida equivale a una botella de cerveza de 12oz (355ml), un vaso de vino de 5oz (148ml) o un vaso de una bebida alcohlica de alta graduacin de 1oz (44ml). Estilo de vida   Trabaje con su mdico para mantenerse en un peso saludable o para perder peso. Pregntele a su mdico cul es el peso recomendable para usted.  Haga al menos 30minutos de ejercicio la mayora de los das de la semana. Estos pueden incluir caminar, nadar o andar en bicicleta.  Realice al menos 30 minutos de ejercicio que fortalezca sus msculos (ejercicios de resistencia) al menos 3 das a la semana. Estos pueden incluir levantar pesas o hacer Pilates.  No consuma ningn producto que contenga nicotina o tabaco, como cigarrillos, cigarrillos electrnicos y tabaco de mascar. Si necesita ayuda para dejar de fumar, consulte al mdico.  Controle su presin arterial en su casa tal como le indic el mdico.  Concurra a todas las visitas de seguimiento como se lo haya indicado el mdico. Esto es importante. Medicamentos  Tome los medicamentos de venta libre y los recetados solamente como se lo haya indicado el mdico. Siga cuidadosamente las indicaciones.  No omita las dosis de medicamentos  para la presin arterial. Los medicamentos pierden eficacia si omite dosis. El hecho de omitir las dosis tambin aumenta el riesgo de otros problemas.  Pregntele a su mdico a qu efectos secundarios o reacciones a los medicamentos debe prestar atencin. Comunquese con un mdico si:  Piensa que tiene una reaccin a los medicamentos que est tomando.  Tiene dolores de cabeza frecuentes (recurrentes).  Se siente mareado.  Tiene hinchazn en los tobillos.  Tiene problemas de visin. Solicite ayuda inmediatamente si:  Siente un dolor de cabeza muy intenso.  Empieza a sentirse desorientado (confundido).  Se siente dbil o adormecido.  Siente que va a desmayarse.  Tiene un dolor muy intenso en las siguientes zonas: ? Pecho. ? Vientre (abdomen).  Vomita ms de una vez.  Tiene dificultad para respirar. Resumen  El trmino hipertensin es otra forma de denominar a la presin arterial elevada.  La presin arterial elevada fuerza al corazn a trabajar ms para bombear la sangre.  Para la mayora de las personas, una presin arterial normal es menor que 120/80.  Las decisiones saludables pueden ayudarle a disminuir su presin arterial. Si no puede bajar   su presin arterial mediante decisiones saludables, es posible que deba tomar medicamentos. Esta informacin no tiene como fin reemplazar el consejo del mdico. Asegrese de hacerle al mdico cualquier pregunta que tenga. Document Released: 02/04/2010 Document Revised: 06/02/2018 Document Reviewed: 06/02/2018 Elsevier Patient Education  2020 Elsevier Inc.  

## 2019-03-22 NOTE — Addendum Note (Signed)
Addended by: Davina Poke on: 03/22/2019 05:29 PM   Modules accepted: Orders

## 2019-03-23 ENCOUNTER — Other Ambulatory Visit: Payer: Self-pay | Admitting: Emergency Medicine

## 2019-03-23 ENCOUNTER — Telehealth: Payer: Self-pay | Admitting: Emergency Medicine

## 2019-03-23 DIAGNOSIS — E1165 Type 2 diabetes mellitus with hyperglycemia: Secondary | ICD-10-CM

## 2019-03-23 LAB — CBC WITH DIFFERENTIAL/PLATELET
Basophils Absolute: 0 10*3/uL (ref 0.0–0.2)
Basos: 0 %
EOS (ABSOLUTE): 0.1 10*3/uL (ref 0.0–0.4)
Eos: 1 %
Hematocrit: 42.2 % (ref 37.5–51.0)
Hemoglobin: 13.6 g/dL (ref 13.0–17.7)
Immature Grans (Abs): 0 10*3/uL (ref 0.0–0.1)
Immature Granulocytes: 0 %
Lymphocytes Absolute: 1.9 10*3/uL (ref 0.7–3.1)
Lymphs: 34 %
MCH: 27.8 pg (ref 26.6–33.0)
MCHC: 32.2 g/dL (ref 31.5–35.7)
MCV: 86 fL (ref 79–97)
Monocytes Absolute: 0.6 10*3/uL (ref 0.1–0.9)
Monocytes: 11 %
Neutrophils Absolute: 3 10*3/uL (ref 1.4–7.0)
Neutrophils: 54 %
Platelets: 257 10*3/uL (ref 150–450)
RBC: 4.89 x10E6/uL (ref 4.14–5.80)
RDW: 14.2 % (ref 11.6–15.4)
WBC: 5.6 10*3/uL (ref 3.4–10.8)

## 2019-03-23 LAB — COMPREHENSIVE METABOLIC PANEL
ALT: 28 IU/L (ref 0–44)
AST: 21 IU/L (ref 0–40)
Albumin/Globulin Ratio: 2 (ref 1.2–2.2)
Albumin: 4.7 g/dL (ref 3.8–4.8)
Alkaline Phosphatase: 100 IU/L (ref 39–117)
BUN/Creatinine Ratio: 22 (ref 10–24)
BUN: 12 mg/dL (ref 8–27)
Bilirubin Total: 0.5 mg/dL (ref 0.0–1.2)
CO2: 22 mmol/L (ref 20–29)
Calcium: 9.2 mg/dL (ref 8.6–10.2)
Chloride: 100 mmol/L (ref 96–106)
Creatinine, Ser: 0.54 mg/dL — ABNORMAL LOW (ref 0.76–1.27)
GFR calc Af Amer: 131 mL/min/{1.73_m2} (ref >59)
GFR calc non Af Amer: 113 mL/min/{1.73_m2} (ref >59)
Globulin, Total: 2.3 g/dL (ref 1.5–4.5)
Glucose: 229 mg/dL — ABNORMAL HIGH (ref 65–99)
Potassium: 4.4 mmol/L (ref 3.5–5.2)
Sodium: 136 mmol/L (ref 134–144)
Total Protein: 7 g/dL (ref 6.0–8.5)

## 2019-03-23 LAB — LIPID PANEL
Chol/HDL Ratio: 2.8 ratio (ref 0.0–5.0)
Cholesterol, Total: 160 mg/dL (ref 100–199)
HDL: 57 mg/dL (ref >39)
LDL Calculated: 93 mg/dL (ref 0–99)
Triglycerides: 49 mg/dL (ref 0–149)
VLDL Cholesterol Cal: 10 mg/dL (ref 5–40)

## 2019-03-23 LAB — PSA: Prostate Specific Ag, Serum: 0.8 ng/mL (ref 0.0–4.0)

## 2019-03-23 LAB — HEMOGLOBIN A1C
Est. average glucose Bld gHb Est-mCnc: 163 mg/dL
Hgb A1c MFr Bld: 7.3 % — ABNORMAL HIGH (ref 4.8–5.6)

## 2019-03-23 MED ORDER — GLIPIZIDE 5 MG PO TABS: 5.0000 mg | ORAL_TABLET | Freq: Every day | ORAL | 3 refills | Status: DC

## 2019-03-23 NOTE — Telephone Encounter (Signed)
Blood results discussed with patient.  Started on glipizide 5 mg once a day.  Patient is intolerant to metformin.

## 2019-03-24 ENCOUNTER — Ambulatory Visit: Payer: Managed Care, Other (non HMO) | Admitting: Emergency Medicine

## 2019-03-25 LAB — SPECIMEN STATUS REPORT

## 2019-03-25 LAB — NOVEL CORONAVIRUS, NAA: SARS-CoV-2, NAA: NOT DETECTED

## 2019-03-27 ENCOUNTER — Encounter: Payer: Self-pay | Admitting: Radiology

## 2019-04-05 ENCOUNTER — Telehealth: Payer: Self-pay | Admitting: Emergency Medicine

## 2019-04-05 NOTE — Telephone Encounter (Signed)
Pt called regarding his amlodipine. Pt states it is causing him to be dizzy and weak. Pt is asking to speak with PCP before the medication is changed. Please advise.   [id number] - [place], [place] - [address]  [address] [place] [address]  Phone: [phone] Fax: [phone]  Not a 24 hour pharmacy; exact hours not known.

## 2019-04-05 NOTE — Telephone Encounter (Signed)
Please advise 

## 2019-04-06 ENCOUNTER — Telehealth: Payer: Self-pay | Admitting: Emergency Medicine

## 2019-04-06 MED ORDER — SITAGLIPTIN PHOSPHATE 50 MG PO TABS: 50.0000 mg | ORAL_TABLET | Freq: Every day | ORAL | 1 refills | Status: DC

## 2019-04-06 NOTE — Telephone Encounter (Signed)
Patient called.  Thanks.

## 2019-04-06 NOTE — Telephone Encounter (Signed)
Had episode of hypoglycemia after starting Glipize. Advised to stop it. Intolerant to Metformin. Will start Januvia or Jardiance depending on insurance coverage.

## 2019-04-11 ENCOUNTER — Telehealth: Payer: Self-pay | Admitting: Emergency Medicine

## 2019-04-11 ENCOUNTER — Other Ambulatory Visit: Payer: Self-pay | Admitting: Emergency Medicine

## 2019-04-11 MED ORDER — LINAGLIPTIN 5 MG PO TABS: 5.0000 mg | ORAL_TABLET | Freq: Every day | ORAL | 3 refills | Status: DC

## 2019-04-11 NOTE — Telephone Encounter (Signed)
We will try Tradjenta then.  Thanks.

## 2019-04-11 NOTE — Telephone Encounter (Signed)
Pt was denied the medication Januvia but his insurance plan. He has to try and fail the following medications: Nesina, Onglyza, and Tradjenta first before they will cover the Januvia.

## 2019-07-24 LAB — HM DIABETES EYE EXAM

## 2019-08-01 ENCOUNTER — Encounter: Payer: Self-pay | Admitting: *Deleted

## 2019-09-20 ENCOUNTER — Ambulatory Visit (INDEPENDENT_AMBULATORY_CARE_PROVIDER_SITE_OTHER): Payer: 59 | Admitting: Emergency Medicine

## 2019-09-20 ENCOUNTER — Encounter: Payer: Self-pay | Admitting: Emergency Medicine

## 2019-09-20 ENCOUNTER — Other Ambulatory Visit: Payer: Self-pay

## 2019-09-20 VITALS — BP 133/76 | HR 73 | Temp 98.1°F | Wt 185.8 lb

## 2019-09-20 DIAGNOSIS — E1159 Type 2 diabetes mellitus with other circulatory complications: Secondary | ICD-10-CM | POA: Diagnosis not present

## 2019-09-20 DIAGNOSIS — I10 Essential (primary) hypertension: Secondary | ICD-10-CM

## 2019-09-20 LAB — GLUCOSE, POCT (MANUAL RESULT ENTRY): POC Glucose: 118 mg/dl — AB (ref 70–99)

## 2019-09-20 LAB — POCT GLYCOSYLATED HEMOGLOBIN (HGB A1C): Hemoglobin A1C: 6.7 % — AB (ref 4.0–5.6)

## 2019-09-20 MED ORDER — ROSUVASTATIN CALCIUM 5 MG PO TABS: 5.0000 mg | ORAL_TABLET | Freq: Every day | ORAL | 3 refills | Status: DC

## 2019-09-20 NOTE — Progress Notes (Signed)
Erik Mcgee 64 y.o.   Chief Complaint  Patient presents with  . Medical Management of Chronic Issues    f/u on htn and diabetes    HISTORY OF PRESENT ILLNESS: This is a 64 y.o. male with history of hypertension diabetes here for follow-up. 1.  Hypertension: On amlodipine 5 mg daily. BP Readings from Last 3 Encounters:  09/20/19 133/76  03/22/19 129/76  09/15/18 136/85    2.  Diabetes: On Tradjenta 5 mg daily.  Intolerant to Metformin.  Never started glipizide and or rosuvastatin that was prescribed last July. Lab Results  Component Value Date   HGBA1C 7.3 (H) 03/22/2019   Has no complaints or medical concerns today.  [place]  HPI   Prior to Admission medications   Medication Sig Start Date End Date Taking? Authorizing Provider  amLODipine (NORVASC) 5 MG tablet Take 1 tablet (5 mg total) by mouth daily. 03/22/19  Yes Osa Fogarty, Eilleen KempfMiguel Jose, MD  rosuvastatin (CRESTOR) 5 MG tablet Take 1 tablet (5 mg total) by mouth daily. 09/20/19  Yes Carlosdaniel Grob, Eilleen KempfMiguel Jose, MD  tadalafil (ADCIRCA/CIALIS) 20 MG tablet Take 0.5-1 tablets (10-20 mg total) by mouth every other day as needed for erectile dysfunction. 07/05/18  Yes Keanthony Poole, Eilleen KempfMiguel Jose, MD  linagliptin (TRADJENTA) 5 MG TABS tablet Take 1 tablet (5 mg total) by mouth daily. 04/11/19 07/10/19  Georgina QuintSagardia, Naziyah Tieszen Jose, MD    No Known Allergies  Patient Active Problem List   Diagnosis Date Noted  . Snoring 07/05/2018  . Erectile dysfunction 07/05/2018  . History of gastroesophageal reflux (GERD) 09/10/2017  . History of hyperglycemia 07/28/2017  . Essential hypertension 07/28/2017    Past Medical History:  Diagnosis Date  . Hypertension     History reviewed. No pertinent surgical history.  Social History   Socioeconomic History  . Marital status: Married    Spouse name: Not on file  . Number of children: Not on file  . Years of education: Not on file  . Highest education  level: Not on file  Occupational History  . Not on file  Tobacco Use  . Smoking status: Never Smoker  . Smokeless tobacco: Never Used  Substance and Sexual Activity  . Alcohol use: No  . Drug use: No  . Sexual activity: Not on file  Other Topics Concern  . Not on file  Social History Narrative  . Not on file   Social Determinants of Health   Financial Resource Strain:   . Difficulty of Paying Living Expenses: Not on file  Food Insecurity:   . Worried About Programme researcher, broadcasting/film/videounning Out of Food in the Last Year: Not on file  . Ran Out of Food in the Last Year: Not on file  Transportation Needs:   . Lack of Transportation (Medical): Not on file  . Lack of Transportation (Non-Medical): Not on file  Physical Activity:   . Days of Exercise per Week: Not on file  . Minutes of Exercise per Session: Not on file  Stress:   . Feeling of Stress : Not on file  Social Connections:   . Frequency of Communication with Friends and Family: Not on file  . Frequency of Social Gatherings with Friends and Family: Not on file  . Attends Religious Services: Not on file  . Active Member of Clubs or Organizations: Not on file  . Attends BankerClub or Organization Meetings: Not on file  . Marital Status: Not on file  Intimate Partner Violence:   .  Fear of Current or Ex-Partner: Not on file  . Emotionally Abused: Not on file  . Physically Abused: Not on file  . Sexually Abused: Not on file    Family History  Problem Relation Age of Onset  . Cancer Mother        melamona  . Heart disease Father      Review of Systems  Constitutional: Negative.  Negative for chills and fever.  HENT: Negative.  Negative for congestion and sore throat.   Eyes: Negative.   Respiratory: Negative.  Negative for cough and shortness of breath.   Cardiovascular: Negative.  Negative for chest pain and palpitations.  Gastrointestinal: Negative.  Negative for abdominal pain, blood in stool, diarrhea, nausea and vomiting.  Genitourinary:  Negative.  Negative for dysuria and hematuria.  Musculoskeletal: Negative.  Negative for back pain, myalgias and neck pain.  Skin: Negative.  Negative for rash.  Neurological: Negative for dizziness and headaches.  Endo/Heme/Allergies: Negative.   All other systems reviewed and are negative.  Vitals:   09/20/19 1552  BP: 133/76  Pulse: 73  Temp: 98.1 F (36.7 C)  SpO2: 99%     Physical Exam Vitals reviewed.  Constitutional:      Appearance: Normal appearance.  HENT:     Head: Normocephalic.  Eyes:     Extraocular Movements: Extraocular movements intact.     Conjunctiva/sclera: Conjunctivae normal.     Pupils: Pupils are equal, round, and reactive to light.  Cardiovascular:     Rate and Rhythm: Normal rate and regular rhythm.     Pulses: Normal pulses.     Heart sounds: Normal heart sounds.  Pulmonary:     Effort: Pulmonary effort is normal.     Breath sounds: Normal breath sounds.  Musculoskeletal:        General: Normal range of motion.     Cervical back: Normal range of motion and neck supple.  Skin:    General: Skin is warm and dry.     Capillary Refill: Capillary refill takes less than 2 seconds.  Neurological:     General: No focal deficit present.     Mental Status: He is alert and oriented to person, place, and time.  Psychiatric:        Mood and Affect: Mood normal.        Behavior: Behavior normal.    Results for orders placed or performed in visit on 09/20/19 (from the past 24 hour(s))  POCT glucose (manual entry)     Status: Abnormal   Collection Time: 09/20/19  4:40 PM  Result Value Ref Range   POC Glucose 118 (A) 70 - 99 mg/dl  POCT glycosylated hemoglobin (Hb A1C)     Status: Abnormal   Collection Time: 09/20/19  4:46 PM  Result Value Ref Range   Hemoglobin A1C 6.7 (A) 4.0 - 5.6 %   HbA1c POC (<> result, manual entry)     HbA1c, POC (prediabetic range)     HbA1c, POC (controlled diabetic range)     A total of 30 minutes was spent with the  patient, greater than 50% of which was in counseling/coordination of care regarding diabetes and hypertension, treatment and management including diet nutrition physical activity and medications, review of all medications and side effects, cardiovascular risks associated with these conditions, need to take rosuvastatin low-dose daily, prognosis, review of most recent blood work and office visit notes, prognosis and need for follow-up in 6 months.   ASSESSMENT & PLAN: Hypertension associated  with diabetes (HCC) Blood pressure well controlled.  Continue amlodipine 5 mg daily. Improved hemoglobin A1c at 6.7.  Continue Tradjenta with diet nutrition and exercise. Follow-up in 6 months.  Nic[place]us was seen today for medical management of chronic issues.  Diagnoses and all orders for this visit:  Hypertension associated with diabetes (HCC) -     Microalbumin/Creatinine Ratio, Urine -     Comprehensive metabolic panel -     Lipid Panel -     POCT glucose (manual entry) -     POCT glycosylated hemoglobin (Hb A1C) -     rosuvastatin (CRESTOR) 5 MG tablet; Take 1 tablet (5 mg total) by mouth daily.    Patient Instructions       If you have lab work done today you will be contacted with your lab results within the next 2 weeks.  If you have not heard from [place] then please contact us. The fastest way to get your results is to register for My Chart.   IF you received an x-ray today, you will receive an invoice from [facility]. Please contact [facility] at [phone] with questions or concerns regarding your invoice.   IF you received labwork today, you will receive an invoice from [facility]. Please contact [facility] at [phone] with questions or concerns regarding your invoice.   Our billing staff will not be able to assist you with questions regarding bills from these companies.  You will be contacted with the lab results as soon as they are available. The fastest way to  get your results is to activate your My Chart account. Instructions are located on the last page of this paperwork. If you have not heard from [place] regarding the results in 2 weeks, please contact this office.      Diabetes mellitus y nutricin, en adultos Diabetes Mellitus and Nutrition, Adult Si sufre de diabetes (diabetes mellitus), es muy importante tener hbitos alimenticios saludables debido a que sus niveles de Psychologist, counselling sangre (glucosa) se ven afectados en gran medida por lo que come y bebe. [place] alimentos saludables en las cantidades [place], aproximadamente a la [employer], [place] ayudar a:  Scientist, physiological glucemia.  Disminuir el riesgo de sufrir una enfermedad cardaca.  Mejorar la presin arterial.  Barista o mantener un peso saludable. Todas las personas que sufren de diabetes son diferentes y cada una tiene necesidades diferentes en cuanto a un plan de alimentacin. El mdico puede recomendarle que trabaje con un especialista en dietas y nutricin (nutricionista) para Tax adviser plan para usted. Su plan de alimentacin puede variar segn factores como:  Las caloras que necesita.  Los medicamentos que toma.  Su peso.  Sus niveles de glucemia, presin arterial y colesterol.  Su nivel de [place].  Otras afecciones que tenga, como enfermedades cardacas o renales. Cmo me afectan los carbohidratos? Los carbohidratos, o hidratos de carbono, afectan su nivel de glucemia ms que cualquier otro tipo de alimento. La ingesta de carbohidratos naturalmente aumenta la cantidad de [employer]. El recuento de carbohidratos es un mtodo destinado a Midwife un registro de la cantidad de carbohidratos que se consumen. El recuento de carbohidratos es importante para Pharmacologist la glucemia a un nivel saludable, especialmente si utiliza insulina o toma determinados medicamentos por va oral para la diabetes. Es importante conocer la cantidad de carbohidratos  que se pueden ingerir en cada comida sin correr Surveyor, minerals. Esto es Government social research officer. Su nutricionista puede ayudarlo  a calcular la cantidad de carbohidratos que debe ingerir en cada comida y en cada refrigerio. Entre los alimentos que contienen carbohidratos, se incluyen:  Pan, cereal, arroz, pastas y galletas.  Papas y maz.  Guisantes, frijoles y lentejas.  Leche y Dentist.  Nils Pyle y [place].  Postres, como pasteles, galletas, helado y caramelos. Cmo me afecta el alcohol? El alcohol puede provocar disminuciones sbitas de la glucemia (hipoglucemia), especialmente si utiliza insulina o toma determinados medicamentos por va oral para la diabetes. La hipoglucemia es una afeccin potencialmente mortal. Los sntomas de la hipoglucemia (somnolencia, mareos y confusin) son similares a los sntomas de haber consumido demasiado alcohol. Si el mdico afirma que el alcohol es seguro para usted, [place] estas pautas:  Limite el consumo de alcohol a no ms de por da si es mujer y no est [place], y a si es hombre. Una medida equivale a 12oz ( ) de cerveza, 5oz ( ) de vino o 1oz (83ml) de bebidas alcohlicas de alta graduacin.  No beba con el estmago vaco.  Mantngase hidratado bebiendo agua, refrescos dietticos o t helado sin azcar.  Tenga en cuenta que los refrescos comunes, los jugos y otras bebida para Engineer, manufacturing pueden contener mucha azcar y se deben contar como carbohidratos. Cules son algunos consejos para seguir este plan?  Leer las etiquetas de los alimentos  Comience por leer el tamao de la porcin en la "Informacin nutricional" en las etiquetas de los alimentos envasados y las bebidas. La cantidad de caloras, carbohidratos, grasas y otros nutrientes mencionados en la etiqueta se basan en una porcin del alimento. Muchos alimentos contienen ms de una porcin por envase.  Verifique la cantidad total de gramos (g) de carbohidratos  totales en una porcin. Puede calcular la cantidad de porciones de carbohidratos al dividir el total de carbohidratos por 15. Por ejemplo, si un alimento tiene un total de 30g de carbohidratos, equivale a 2 porciones de carbohidratos.  Verifique la cantidad de gramos (g) de grasas saturadas y grasas trans en una porcin. Escoja alimentos que no contengan grasa o que tengan un bajo contenido.  Verifique la cantidad de miligramos (mg) de sal (sodio) en una porcin. La mayora de las personas deben limitar la ingesta de sodio total a menos de 2300mg  por .  Siempre consulte la informacin nutricional de los alimentos etiquetados como "con bajo contenido de grasa" o "sin grasa". Estos alimentos pueden tener un mayor contenido de Futures trader agregada o carbohidratos refinados, y deben evitarse.  Hable con su nutricionista para identificar sus objetivos diarios en cuanto a los nutrientes mencionados en la etiqueta. Al ir de compras  Evite comprar alimentos procesados, enlatados o precocinados. Estos alimentos tienden a International aid/development worker mayor cantidad de [place], sodio y azcar agregada.  Compre en la zona exterior de la tienda de comestibles. Esta zona incluye frutas y verduras frescas, granos a granel, carnes frescas y productos lcteos frescos. Al cocinar  Utilice mtodos de coccin a baja temperatura, como hornear, en lugar de mtodos de coccin a alta temperatura, como frer en abundante aceite.  Cocine con aceites saludables, como el aceite de [place], canola o [place].  Evite cocinar con manteca, crema o carnes con alto contenido de grasa. Planificacin de las comidas  Coma las comidas y los refrigerios regularmente, preferentemente a la misma hora todos [place]. Evite pasar largos perodos de tiempo sin comer.  Consuma alimentos ricos en fibra, como frutas frescas, verduras, frijoles y cereales integrales. Consulte a su nutricionista sobre cuntas porciones de  carbohidratos puede consumir en cada  comida.  Consuma entre 4 y 6 onzas (oz) de protenas magras por da, como carnes [place], pollo, pescado, huevos o tofu. Una onza de protena magra equivale a: ? 1 onza de carne, pollo o pescado. ? 1huevo. ?  taza de tofu.  Coma algunos alimentos por da que contengan grasas saludables, como aguacates, frutos secos, semillas y pescado. Estilo de vida  Controle su nivel de glucemia con regularidad.  Haga actividad fsica habitualmente como se lo haya indicado el mdico. Esto puede incluir lo siguiente: ? semanales de ejercicio de intensidad moderada o alta. Esto podra incluir caminatas dinmicas, ciclismo o gimnasia acutica. ? Realizar ejercicios de elongacin y de fortalecimiento, como yoga o levantamiento de pesas, por lo menos 2veces por semana.  Tome los [employer] se lo haya indicado el mdico.  No consuma ningn producto que contenga nicotina o tabaco, como cigarrillos y Administrator, Civil Service. Si necesita ayuda para dejar de fumar, consulte al CIGNA con un asesor o instructor en diabetes para identificar estrategias para controlar el estrs y cualquier desafo emocional y social. Preguntas para hacerle al mdico  Es necesario que consulte a IT trainer en el cuidado de la diabetes?  Es necesario que me rena con un nutricionista?  A qu nmero puedo llamar si tengo preguntas?  Cules son los mejores momentos para controlar la glucemia? Dnde encontrar ms informacin:  Asociacin Estadoun[place]ense de la Diabetes (American Diabetes Association): diabetes.org  Academia de Nutricin y Pension scheme manager (Academy of Nutrition and Dietetics): www.eatright.org  The [employer] de la Diabetes y las Enfermedades Digestivas y [place] [facility] Centers of Diabetes and Digestive and Kidney Diseases, NIH): CarFlippers.tn Resumen  Un plan de alimentacin saludable lo ayudar a Scientist, physiological glucemia y Pharmacologist un estilo de vida saludable.   Trabajar con un especialista en dietas y nutricin (nutricionista) puede ayudarlo a Designer, television/film set de alimentacin para usted.  Tenga en cuenta que los carbohidratos (hidratos de carbono) y el alcohol tienen efectos inmediatos en sus niveles de glucemia. Es importante contar los carbohidratos que ingiere y consumir alcohol con prudencia. Esta informacin no tiene Theme park manager el consejo del mdico. Asegrese de hacerle al mdico cualquier pregunta que tenga. Document Revised: 04/27/2017 Document Reviewed: 12/07/2016 Elsevier Patient Education  2020 Elsevier Inc.      Edwina Barth, MD [facility]

## 2019-09-20 NOTE — Assessment & Plan Note (Signed)
Blood pressure well controlled.  Continue amlodipine 5 mg daily. Improved hemoglobin A1c at 6.7.  Continue Tradjenta with diet nutrition and exercise. Follow-up in 6 months.

## 2019-09-20 NOTE — Patient Instructions (Addendum)
   If you have lab work done today you will be contacted with your lab results within the next 2 weeks.  If you have not heard from us then please contact us. The fastest way to get your results is to register for My Chart.   IF you received an x-ray today, you will receive an invoice from [facility]. Please contact  Radiology at [phone] with questions or concerns regarding your invoice.   IF you received labwork today, you will receive an invoice from [facility]. Please contact [facility] at [phone] with questions or concerns regarding your invoice.   Our billing staff will not be able to assist you with questions regarding bills from these companies.  You will be contacted with the lab results as soon as they are available. The fastest way to get your results is to activate your My Chart account. Instructions are located on the last page of this paperwork. If you have not heard from us regarding the results in 2 weeks, please contact this office.     Diabetes mellitus y nutricin, en adultos Diabetes Mellitus and Nutrition, Adult Si sufre de diabetes (diabetes mellitus), es muy importante tener hbitos alimenticios saludables debido a que sus niveles de azcar en la sangre (glucosa) se ven afectados en gran medida por lo que come y bebe. [place] alimentos saludables en las cantidades adecuadas, aproximadamente a la misma hora todos los das, lo ayudar a:  Controlar la glucemia.  Disminuir el riesgo de sufrir una enfermedad cardaca.  Mejorar la presin arterial.  Alcanzar o mantener un peso saludable. Todas las personas que sufren de diabetes son diferentes y cada una tiene necesidades diferentes en cuanto a un plan de alimentacin. El mdico puede recomendarle que trabaje con un especialista en dietas y nutricin (nutricionista) para elaborar el mejor plan para usted. Su plan de alimentacin puede variar segn factores como:  Las caloras que  necesita.  Los medicamentos que toma.  Su peso.  Sus niveles de glucemia, presin arterial y colesterol.  Su nivel de actividad.  Otras afecciones que tenga, como enfermedades cardacas o renales. Cmo me afectan los carbohidratos? Los carbohidratos, o hidratos de carbono, afectan su nivel de glucemia ms que cualquier otro tipo de alimento. La ingesta de carbohidratos naturalmente aumenta la cantidad de glucosa en la sangre. El recuento de carbohidratos es un mtodo destinado a llevar un registro de la cantidad de carbohidratos que se consumen. El recuento de carbohidratos es importante para mantener la glucemia a un nivel saludable, especialmente si utiliza insulina o toma determinados medicamentos por va oral para la diabetes. Es importante conocer la cantidad de carbohidratos que se pueden ingerir en cada comida sin correr ningn riesgo. Esto es diferente en cada persona. Su nutricionista puede ayudarlo a calcular la cantidad de carbohidratos que debe ingerir en cada comida y en cada refrigerio. Entre los alimentos que contienen carbohidratos, se incluyen:  Pan, cereal, arroz, pastas y galletas.  Papas y maz.  Guisantes, frijoles y lentejas.  Leche y yogur.  Frutas y jugo.  Postres, como pasteles, galletas, helado y caramelos. Cmo me afecta el alcohol? El alcohol puede provocar disminuciones sbitas de la glucemia (hipoglucemia), especialmente si utiliza insulina o toma determinados medicamentos por va oral para la diabetes. La hipoglucemia es una afeccin potencialmente mortal. Los sntomas de la hipoglucemia (somnolencia, mareos y confusin) son similares a los sntomas de haber consumido demasiado alcohol. Si el mdico afirma que el alcohol es seguro para usted, siga estas pautas:    Limite el consumo de alcohol a no ms de 1medida por da si es mujer y no est embarazada, y a 2medidas si es hombre. Una medida equivale a 12oz (355ml) de cerveza, 5oz (148ml) de vino o  1oz (44ml) de bebidas alcohlicas de alta graduacin.  No beba con el estmago vaco.  Mantngase hidratado bebiendo agua, refrescos dietticos o t helado sin azcar.  Tenga en cuenta que los refrescos comunes, los jugos y otras bebida para mezclar pueden contener mucha azcar y se deben contar como carbohidratos. Cules son algunos consejos para seguir este plan?  Leer las etiquetas de los alimentos  Comience por leer el tamao de la porcin en la "Informacin nutricional" en las etiquetas de los alimentos envasados y las bebidas. La cantidad de caloras, carbohidratos, grasas y otros nutrientes mencionados en la etiqueta se basan en una porcin del alimento. Muchos alimentos contienen ms de una porcin por envase.  Verifique la cantidad total de gramos (g) de carbohidratos totales en una porcin. Puede calcular la cantidad de porciones de carbohidratos al dividir el total de carbohidratos por 15. Por ejemplo, si un alimento tiene un total de 30g de carbohidratos, equivale a 2 porciones de carbohidratos.  Verifique la cantidad de gramos (g) de grasas saturadas y grasas trans en una porcin. Escoja alimentos que no contengan grasa o que tengan un bajo contenido.  Verifique la cantidad de miligramos (mg) de sal (sodio) en una porcin. La mayora de las personas deben limitar la ingesta de sodio total a menos de 2300mg por da.  Siempre consulte la informacin nutricional de los alimentos etiquetados como "con bajo contenido de grasa" o "sin grasa". Estos alimentos pueden tener un mayor contenido de azcar agregada o carbohidratos refinados, y deben evitarse.  Hable con su nutricionista para identificar sus objetivos diarios en cuanto a los nutrientes mencionados en la etiqueta. Al ir de compras  Evite comprar alimentos procesados, enlatados o precocinados. Estos alimentos tienden a tener una mayor cantidad de grasa, sodio y azcar agregada.  Compre en la zona exterior de la tienda de  comestibles. Esta zona incluye frutas y verduras frescas, granos a granel, carnes frescas y productos lcteos frescos. Al cocinar  Utilice mtodos de coccin a baja temperatura, como hornear, en lugar de mtodos de coccin a alta temperatura, como frer en abundante aceite.  Cocine con aceites saludables, como el aceite de oliva, canola o girasol.  Evite cocinar con manteca, crema o carnes con alto contenido de grasa. Planificacin de las comidas  Coma las comidas y los refrigerios regularmente, preferentemente a la misma hora todos los das. Evite pasar largos perodos de tiempo sin comer.  Consuma alimentos ricos en fibra, como frutas frescas, verduras, frijoles y cereales integrales. Consulte a su nutricionista sobre cuntas porciones de carbohidratos puede consumir en cada comida.  Consuma entre 4 y 6 onzas (oz) de protenas magras por da, como carnes magras, pollo, pescado, huevos o tofu. Una onza de protena magra equivale a: ? 1 onza de carne, pollo o pescado. ? 1huevo. ?  taza de tofu.  Coma algunos alimentos por da que contengan grasas saludables, como aguacates, frutos secos, semillas y pescado. Estilo de vida  Controle su nivel de glucemia con regularidad.  Haga actividad fsica habitualmente como se lo haya indicado el mdico. Esto puede incluir lo siguiente: ? 150minutos semanales de ejercicio de intensidad moderada o alta. Esto podra incluir caminatas dinmicas, ciclismo o gimnasia acutica. ? Realizar ejercicios de elongacin y de fortalecimiento, como yoga o levantamiento   de pesas, por lo menos 2veces por semana.  Tome los medicamentos como se lo haya indicado el mdico.  No consuma ningn producto que contenga nicotina o tabaco, como cigarrillos y cigarrillos electrnicos. Si necesita ayuda para dejar de fumar, consulte al mdico.  Trabaje con un asesor o instructor en diabetes para identificar estrategias para controlar el estrs y cualquier desafo emocional  y social. Preguntas para hacerle al mdico  Es necesario que consulte a un instructor en el cuidado de la diabetes?  Es necesario que me rena con un nutricionista?  A qu nmero puedo llamar si tengo preguntas?  Cules son los mejores momentos para controlar la glucemia? Dnde encontrar ms informacin:  Asociacin Estadounidense de la Diabetes (American Diabetes Association): diabetes.org  Academia de Nutricin y Diettica (Academy of Nutrition and Dietetics): www.eatright.org  Instituto Nacional de la Diabetes y las Enfermedades Digestivas y Renales (National Institute of Diabetes and Digestive and Kidney Diseases, NIH): www.niddk.nih.gov Resumen  Un plan de alimentacin saludable lo ayudar a controlar la glucemia y mantener un estilo de vida saludable.  Trabajar con un especialista en dietas y nutricin (nutricionista) puede ayudarlo a elaborar el mejor plan de alimentacin para usted.  Tenga en cuenta que los carbohidratos (hidratos de carbono) y el alcohol tienen efectos inmediatos en sus niveles de glucemia. Es importante contar los carbohidratos que ingiere y consumir alcohol con prudencia. Esta informacin no tiene como fin reemplazar el consejo del mdico. Asegrese de hacerle al mdico cualquier pregunta que tenga. Document Revised: 04/27/2017 Document Reviewed: 12/07/2016 Elsevier Patient Education  2020 Elsevier Inc.  

## 2019-09-21 ENCOUNTER — Encounter: Payer: Self-pay | Admitting: Radiology

## 2019-09-21 LAB — MICROALBUMIN / CREATININE URINE RATIO
Creatinine, Urine: 45.9 mg/dL
Microalb/Creat Ratio: 13 mg/g creat (ref 0–29)
Microalbumin, Urine: 5.9 ug/mL

## 2019-09-21 LAB — COMPREHENSIVE METABOLIC PANEL
ALT: 21 IU/L (ref 0–44)
AST: 20 IU/L (ref 0–40)
Albumin/Globulin Ratio: 2 (ref 1.2–2.2)
Albumin: 4.5 g/dL (ref 3.8–4.8)
Alkaline Phosphatase: 83 IU/L (ref 39–117)
BUN/Creatinine Ratio: 19 (ref 10–24)
BUN: 13 mg/dL (ref 8–27)
Bilirubin Total: 0.5 mg/dL (ref 0.0–1.2)
CO2: 25 mmol/L (ref 20–29)
Calcium: 9.3 mg/dL (ref 8.6–10.2)
Chloride: 102 mmol/L (ref 96–106)
Creatinine, Ser: 0.68 mg/dL — ABNORMAL LOW (ref 0.76–1.27)
GFR calc Af Amer: 118 mL/min/{1.73_m2} (ref >59)
GFR calc non Af Amer: 102 mL/min/{1.73_m2} (ref >59)
Globulin, Total: 2.3 g/dL (ref 1.5–4.5)
Glucose: 119 mg/dL — ABNORMAL HIGH (ref 65–99)
Potassium: 4.3 mmol/L (ref 3.5–5.2)
Sodium: 137 mmol/L (ref 134–144)
Total Protein: 6.8 g/dL (ref 6.0–8.5)

## 2019-09-21 LAB — LIPID PANEL
Chol/HDL Ratio: 2.6 ratio (ref 0.0–5.0)
Cholesterol, Total: 153 mg/dL (ref 100–199)
HDL: 60 mg/dL (ref >39)
LDL Chol Calc (NIH): 84 mg/dL (ref 0–99)
Triglycerides: 37 mg/dL (ref 0–149)
VLDL Cholesterol Cal: 9 mg/dL (ref 5–40)

## 2020-03-19 ENCOUNTER — Ambulatory Visit: Payer: 59 | Admitting: Emergency Medicine

## 2020-03-19 ENCOUNTER — Other Ambulatory Visit: Payer: Self-pay

## 2020-03-19 ENCOUNTER — Encounter: Payer: Self-pay | Admitting: Emergency Medicine

## 2020-03-19 VITALS — BP 124/71 | HR 76 | Temp 98.2°F | Resp 16 | Ht 69.0 in | Wt 177.0 lb

## 2020-03-19 DIAGNOSIS — I10 Essential (primary) hypertension: Secondary | ICD-10-CM

## 2020-03-19 DIAGNOSIS — E1159 Type 2 diabetes mellitus with other circulatory complications: Secondary | ICD-10-CM

## 2020-03-19 DIAGNOSIS — Z789 Other specified health status: Secondary | ICD-10-CM | POA: Diagnosis not present

## 2020-03-19 DIAGNOSIS — E1165 Type 2 diabetes mellitus with hyperglycemia: Secondary | ICD-10-CM | POA: Diagnosis not present

## 2020-03-19 LAB — POCT GLYCOSYLATED HEMOGLOBIN (HGB A1C): Hemoglobin A1C: 6.5 % — AB (ref 4.0–5.6)

## 2020-03-19 LAB — GLUCOSE, POCT (MANUAL RESULT ENTRY): POC Glucose: 119 mg/dl — AB (ref 70–99)

## 2020-03-19 NOTE — Patient Instructions (Addendum)
   If you have lab work done today you will be contacted with your lab results within the next 2 weeks.  If you have not heard from us then please contact us. The fastest way to get your results is to register for My Chart.   IF you received an x-ray today, you will receive an invoice from [facility]. Please contact [facility] at [phone] with questions or concerns regarding your invoice.   IF you received labwork today, you will receive an invoice from [facility]. Please contact [facility] at [phone] with questions or concerns regarding your invoice.   Our billing staff will not be able to assist you with questions regarding bills from these companies.  You will be contacted with the lab results as soon as they are available. The fastest way to get your results is to activate your My Chart account. Instructions are located on the last page of this paperwork. If you have not heard from us regarding the results in 2 weeks, please contact this office.     Diabetes mellitus y nutricin, en adultos Diabetes Mellitus and Nutrition, Adult Si sufre de diabetes (diabetes mellitus), es muy importante tener hbitos alimenticios saludables debido a que sus niveles de azcar en la sangre (glucosa) se ven afectados en gran medida por lo que come y bebe. [place] alimentos saludables en las cantidades adecuadas, aproximadamente a la misma hora todos los das, lo ayudar a:  Controlar la glucemia.  Disminuir el riesgo de sufrir una enfermedad cardaca.  Mejorar la presin arterial.  Alcanzar o mantener un peso saludable. Todas las personas que sufren de diabetes son diferentes y cada una tiene necesidades diferentes en cuanto a un plan de alimentacin. El mdico puede recomendarle que trabaje con un especialista en dietas y nutricin (nutricionista) para elaborar el mejor plan para usted. Su plan de alimentacin puede variar segn factores como:  Las caloras que  necesita.  Los medicamentos que toma.  Su peso.  Sus niveles de glucemia, presin arterial y colesterol.  Su nivel de actividad.  Otras afecciones que tenga, como enfermedades cardacas o renales. Cmo me afectan los carbohidratos? Los carbohidratos, o hidratos de carbono, afectan su nivel de glucemia ms que cualquier otro tipo de alimento. La ingesta de carbohidratos naturalmente aumenta la cantidad de glucosa en la sangre. El recuento de carbohidratos es un mtodo destinado a llevar un registro de la cantidad de carbohidratos que se consumen. El recuento de carbohidratos es importante para mantener la glucemia a un nivel saludable, especialmente si utiliza insulina o toma determinados medicamentos por va oral para la diabetes. Es importante conocer la cantidad de carbohidratos que se pueden ingerir en cada comida sin correr ningn riesgo. Esto es diferente en cada persona. Su nutricionista puede ayudarlo a calcular la cantidad de carbohidratos que debe ingerir en cada comida y en cada refrigerio. Entre los alimentos que contienen carbohidratos, se incluyen:  Pan, cereal, arroz, pastas y galletas.  Papas y maz.  Guisantes, frijoles y lentejas.  Leche y yogur.  Frutas y jugo.  Postres, como pasteles, galletas, helado y caramelos. Cmo me afecta el alcohol? El alcohol puede provocar disminuciones sbitas de la glucemia (hipoglucemia), especialmente si utiliza insulina o toma determinados medicamentos por va oral para la diabetes. La hipoglucemia es una afeccin potencialmente mortal. Los sntomas de la hipoglucemia (somnolencia, mareos y confusin) son similares a los sntomas de haber consumido demasiado alcohol. Si el mdico afirma que el alcohol es seguro para usted, siga estas pautas:    Limite el consumo de alcohol a no ms de 1medida por da si es mujer y no est embarazada, y a 2medidas si es hombre. Una medida equivale a 12oz (355ml) de cerveza, 5oz (148ml) de vino o  1oz (44ml) de bebidas alcohlicas de alta graduacin.  No beba con el estmago vaco.  Mantngase hidratado bebiendo agua, refrescos dietticos o t helado sin azcar.  Tenga en cuenta que los refrescos comunes, los jugos y otras bebida para mezclar pueden contener mucha azcar y se deben contar como carbohidratos. Cules son algunos consejos para seguir este plan?  Leer las etiquetas de los alimentos  Comience por leer el tamao de la porcin en la "Informacin nutricional" en las etiquetas de los alimentos envasados y las bebidas. La cantidad de caloras, carbohidratos, grasas y otros nutrientes mencionados en la etiqueta se basan en una porcin del alimento. Muchos alimentos contienen ms de una porcin por envase.  Verifique la cantidad total de gramos (g) de carbohidratos totales en una porcin. Puede calcular la cantidad de porciones de carbohidratos al dividir el total de carbohidratos por 15. Por ejemplo, si un alimento tiene un total de 30g de carbohidratos, equivale a 2 porciones de carbohidratos.  Verifique la cantidad de gramos (g) de grasas saturadas y grasas trans en una porcin. Escoja alimentos que no contengan grasa o que tengan un bajo contenido.  Verifique la cantidad de miligramos (mg) de sal (sodio) en una porcin. La mayora de las personas deben limitar la ingesta de sodio total a menos de 2300mg por da.  Siempre consulte la informacin nutricional de los alimentos etiquetados como "con bajo contenido de grasa" o "sin grasa". Estos alimentos pueden tener un mayor contenido de azcar agregada o carbohidratos refinados, y deben evitarse.  Hable con su nutricionista para identificar sus objetivos diarios en cuanto a los nutrientes mencionados en la etiqueta. Al ir de compras  Evite comprar alimentos procesados, enlatados o precocinados. Estos alimentos tienden a tener una mayor cantidad de grasa, sodio y azcar agregada.  Compre en la zona exterior de la tienda de  comestibles. Esta zona incluye frutas y verduras frescas, granos a granel, carnes frescas y productos lcteos frescos. Al cocinar  Utilice mtodos de coccin a baja temperatura, como hornear, en lugar de mtodos de coccin a alta temperatura, como frer en abundante aceite.  Cocine con aceites saludables, como el aceite de oliva, canola o girasol.  Evite cocinar con manteca, crema o carnes con alto contenido de grasa. Planificacin de las comidas  Coma las comidas y los refrigerios regularmente, preferentemente a la misma hora todos los das. Evite pasar largos perodos de tiempo sin comer.  Consuma alimentos ricos en fibra, como frutas frescas, verduras, frijoles y cereales integrales. Consulte a su nutricionista sobre cuntas porciones de carbohidratos puede consumir en cada comida.  Consuma entre 4 y 6 onzas (oz) de protenas magras por da, como carnes magras, pollo, pescado, huevos o tofu. Una onza de protena magra equivale a: ? 1 onza de carne, pollo o pescado. ? 1huevo. ?  taza de tofu.  Coma algunos alimentos por da que contengan grasas saludables, como aguacates, frutos secos, semillas y pescado. Estilo de vida  Controle su nivel de glucemia con regularidad.  Haga actividad fsica habitualmente como se lo haya indicado el mdico. Esto puede incluir lo siguiente: ? 150minutos semanales de ejercicio de intensidad moderada o alta. Esto podra incluir caminatas dinmicas, ciclismo o gimnasia acutica. ? Realizar ejercicios de elongacin y de fortalecimiento, como yoga o levantamiento   de pesas, por lo menos 2veces por semana.  Tome los medicamentos como se lo haya indicado el mdico.  No consuma ningn producto que contenga nicotina o tabaco, como cigarrillos y cigarrillos electrnicos. Si necesita ayuda para dejar de fumar, consulte al mdico.  Trabaje con un asesor o instructor en diabetes para identificar estrategias para controlar el estrs y cualquier desafo emocional  y social. Preguntas para hacerle al mdico  Es necesario que consulte a un instructor en el cuidado de la diabetes?  Es necesario que me rena con un nutricionista?  A qu nmero puedo llamar si tengo preguntas?  Cules son los mejores momentos para controlar la glucemia? Dnde encontrar ms informacin:  Asociacin Estadounidense de la Diabetes (American Diabetes Association): diabetes.org  Academia de Nutricin y Diettica (Academy of Nutrition and Dietetics): www.eatright.org  Instituto Nacional de la Diabetes y las Enfermedades Digestivas y Renales (National Institute of Diabetes and Digestive and Kidney Diseases, NIH): www.niddk.nih.gov Resumen  Un plan de alimentacin saludable lo ayudar a controlar la glucemia y mantener un estilo de vida saludable.  Trabajar con un especialista en dietas y nutricin (nutricionista) puede ayudarlo a elaborar el mejor plan de alimentacin para usted.  Tenga en cuenta que los carbohidratos (hidratos de carbono) y el alcohol tienen efectos inmediatos en sus niveles de glucemia. Es importante contar los carbohidratos que ingiere y consumir alcohol con prudencia. Esta informacin no tiene como fin reemplazar el consejo del mdico. Asegrese de hacerle al mdico cualquier pregunta que tenga. Document Revised: 04/27/2017 Document Reviewed: 12/07/2016 Elsevier Patient Education  2020 Elsevier Inc.  

## 2020-03-19 NOTE — Assessment & Plan Note (Signed)
Well-controlled hypertension.  Continue amlodipine 5 mg daily. Well-controlled diabetes with hemoglobin A1c of 6.5.  Continue Tradjenta 5 mg daily. Diet and nutrition discussed.  Intolerant to Metformin and statins. Follow-up in 6 months.

## 2020-03-19 NOTE — Progress Notes (Signed)
Erik Mcgee 64 y.o.   Chief Complaint  Patient presents with   Diabetes    follow up 6 month   Hypertension    HISTORY OF PRESENT ILLNESS: This is a 64 y.o. male with history of hypertension and diabetes here for follow-up. #1 diabetes: Intolerant to Metformin.  Presently taking Tradjenta 5 mg daily.  Doing well. Wt Readings from Last 3 Encounters:  03/19/20 177 lb (80.3 kg)  09/20/19 185 lb 12.8 oz (84.3 kg)  03/22/19 189 lb (85.7 kg)   Lab Results  Component Value Date   CHOL 153 09/20/2019   HDL 60 09/20/2019   LDLCALC 84 09/20/2019   TRIG 37 09/20/2019   CHOLHDL 2.6 09/20/2019  Statin intolerant.   Lab Results  Component Value Date   HGBA1C 6.7 (A) 09/20/2019  #2 hypertension: On amlodipine 5 mg daily. BP Readings from Last 3 Encounters:  03/19/20 124/71  09/20/19 133/76  03/22/19 129/76   No complaints or medical concerns today.  Stress level much lower due to recent finalized divorce.  HPI   Prior to Admission medications   Medication Sig Start Date End Date Taking? Authorizing Provider  amLODipine (NORVASC) 5 MG tablet Take 1 tablet (5 mg total) by mouth daily. 03/22/19  Yes Erik Mcgee, Erik Kempf, MD  linagliptin (TRADJENTA) 5 MG TABS tablet Take 1 tablet (5 mg total) by mouth daily. 04/11/19 07/10/19  Erik Quint, MD  rosuvastatin (CRESTOR) 5 MG tablet Take 1 tablet (5 mg total) by mouth daily. Patient not taking: Reported on 03/19/2020 09/20/19   Erik Quint, MD  tadalafil (ADCIRCA/CIALIS) 20 MG tablet Take 0.5-1 tablets (10-20 mg total) by mouth every other day as needed for erectile dysfunction. Patient not taking: Reported on 03/19/2020 07/05/18   Erik Quint, MD    No Known Allergies  Patient Active Problem List   Diagnosis Date Noted   Snoring 07/05/2018   Erectile dysfunction 07/05/2018   History of gastroesophageal reflux (GERD) 09/10/2017   History of hyperglycemia 07/28/2017   Hypertension associated with  diabetes (HCC) 07/28/2017    Past Medical History:  Diagnosis Date   Hypertension     History reviewed. No pertinent surgical history.  Social History   Socioeconomic History   Marital status: Married    Spouse name: Not on file   Number of children: Not on file   Years of education: Not on file   Highest education level: Not on file  Occupational History   Not on file  Tobacco Use   Smoking status: Never Smoker   Smokeless tobacco: Never Used  Substance and Sexual Activity   Alcohol use: No   Drug use: No   Sexual activity: Not on file  Other Topics Concern   Not on file  Social History Narrative   Not on file   Social Determinants of Health   Financial Resource Strain:    Difficulty of Paying Living Expenses:   Food Insecurity:    Worried About Running Out of Food in the Last Year:    Barista in the Last Year:   Transportation Needs:    Freight forwarder (Medical):    Lack of Transportation (Non-Medical):   Physical Activity:    Days of Exercise per Week:    Minutes of Exercise per Session:   Stress:    Feeling of Stress :   Social Connections:    Frequency of Communication with Friends and Family:    Frequency of Social Gatherings with  Friends and Family:    Attends Religious Services:    Active Member of Clubs or Organizations:    Attends Engineer, structural:    Marital Status:   Intimate Partner Violence:    Fear of Current or Ex-Partner:    Emotionally Abused:    Physically Abused:    Sexually Abused:     Family History  Problem Relation Age of Onset   Cancer Mother        melamona   Heart disease Father      Review of Systems  Constitutional: Negative.  Negative for chills and fever.  HENT: Negative.  Negative for congestion and sore throat.   Respiratory: Negative.  Negative for cough and shortness of breath.   Cardiovascular: Negative.  Negative for chest pain and palpitations.    Gastrointestinal: Negative.  Negative for abdominal pain, blood in stool, diarrhea, nausea and vomiting.  Genitourinary: Negative.  Negative for hematuria.  Musculoskeletal: Negative.  Negative for myalgias and neck pain.  Skin: Negative.  Negative for rash.  Neurological: Negative.  Negative for dizziness and headaches.  Endo/Heme/Allergies: Negative.   All other systems reviewed and are negative.  Vitals:   03/19/20 1547  BP: 124/71  Pulse: 76  Resp: 16  Temp: 98.2 F (36.8 C)  SpO2: 98%     Physical Exam Vitals reviewed.  Constitutional:      Appearance: Normal appearance.  HENT:     Head: Normocephalic.  Eyes:     Extraocular Movements: Extraocular movements intact.     Conjunctiva/sclera: Conjunctivae normal.     Pupils: Pupils are equal, round, and reactive to light.  Cardiovascular:     Rate and Rhythm: Normal rate and regular rhythm.     Pulses: Normal pulses.     Heart sounds: Normal heart sounds.  Pulmonary:     Effort: Pulmonary effort is normal.     Breath sounds: Normal breath sounds.  Musculoskeletal:        General: Normal range of motion.     Cervical back: Normal range of motion and neck supple.  Skin:    General: Skin is warm and dry.     Capillary Refill: Capillary refill takes less than 2 seconds.  Neurological:     General: No focal deficit present.     Mental Status: He is alert and oriented to person, place, and time.  Psychiatric:        Mood and Affect: Mood normal.        Behavior: Behavior normal.      A total of 30 minutes was spent with the patient, greater than 50% of which was in counseling/coordination of care regarding diabetes and hypertension, treatment and cardiovascular risks associated with these conditions, review of all medications, review of most recent office visit notes, review of most recent blood work results including today's hemoglobin A1c, diet and nutrition, prognosis and need for follow-up in 6  months.  ASSESSMENT & PLAN: Hypertension associated with diabetes (HCC) Well-controlled hypertension.  Continue amlodipine 5 mg daily. Well-controlled diabetes with hemoglobin A1c of 6.5.  Continue Tradjenta 5 mg daily. Diet and nutrition discussed.  Intolerant to Metformin and statins. Follow-up in 6 months.  Erik Mcgee was seen today for diabetes and hypertension.  Diagnoses and all orders for this visit:  Hypertension associated with diabetes (HCC) -     Comprehensive metabolic panel -     Lipid panel  Type 2 diabetes mellitus with hyperglycemia, without long-term current use of insulin (HCC) -  POCT glucose (manual entry) -     POCT glycosylated hemoglobin (Hb A1C) -     HM Diabetes Foot Exam  Statin intolerance    Patient Instructions       If you have lab work done today you will be contacted with your lab results within the next 2 weeks.  If you have not heard from us then please contact us. The fastest way to get your results is to register for My Chart.   IF you received an x-ray today, you will receive an invoice from [facility]. Please contact [facility] at [phone] with questions or concerns regarding your invoice.   IF you received labwork today, you will receive an invoice from [facility]. Please contact [facility] at [phone] with questions or concerns regarding your invoice.   Our billing staff will not be able to assist you with questions regarding bills from these companies.  You will be contacted with the lab results as soon as they are available. The fastest way to get your results is to activate your My Chart account. Instructions are located on the last page of this paperwork. If you have not heard from us regarding the results in 2 weeks, please contact this office.     Diabetes mellitus y nutricin, en adultos Diabetes Mellitus and Nutrition, Adult Si sufre de diabetes (diabetes mellitus), es muy importante tener hbitos  alimenticios saludables debido a que sus niveles de Psychologist, counsellingazcar en la sangre (glucosa) se ven afectados en gran medida por lo que come y bebe. [place] alimentos saludables en las cantidades [place]adecuadas, aproximadamente a la Smith Internationalmisma hora todos los das, [place] ayudar a:  Scientist, physiologicalControlar la glucemia.  Disminuir el riesgo de sufrir una enfermedad cardaca.  Mejorar la presin arterial.  BaristaAlcanzar o mantener un peso saludable. Todas las personas que sufren de diabetes son diferentes y cada una tiene necesidades diferentes en cuanto a un plan de alimentacin. El mdico puede recomendarle que trabaje con un especialista en dietas y nutricin (nutricionista) para Tax adviserelaborar el mejor plan para usted. Su plan de alimentacin puede variar segn factores como:  Las caloras que necesita.  Los medicamentos que toma.  Su peso.  Sus niveles de glucemia, presin arterial y colesterol.  Su nivel de [place]inesactividad.  Otras afecciones que tenga, como enfermedades cardacas o renales. Cmo me afectan los carbohidratos? Los carbohidratos, o hidratos de carbono, afectan su nivel de glucemia ms que cualquier otro tipo de alimento. La ingesta de carbohidratos naturalmente aumenta la cantidad de CarMaxglucosa en la sangre. El recuento de carbohidratos es un mtodo destinado a Midwifellevar un registro de la cantidad de carbohidratos que se consumen. El recuento de carbohidratos es importante para Pharmacologistmantener la glucemia a un nivel saludable, especialmente si utiliza insulina o toma determinados medicamentos por va oral para la diabetes. Es importante conocer la cantidad de carbohidratos que se pueden ingerir en cada comida sin correr Surveyor, mineralsningn riesgo. Esto es Government social research officerdiferente en cada persona. Su nutricionista puede ayudarlo a calcular la cantidad de carbohidratos que debe ingerir en cada comida y en cada refrigerio. Entre los alimentos que contienen carbohidratos, se incluyen:  Pan, cereal, arroz, pastas y galletas.  Papas y maz.  Guisantes, frijoles y  lentejas.  Leche y Dentistyogur.  Nils PyleFrutas y Sloveniajugo.  Postres, como pasteles, galletas, helado y caramelos. Cmo me afecta el alcohol? El alcohol puede provocar disminuciones sbitas de la glucemia (hipoglucemia), especialmente si utiliza insulina o toma determinados medicamentos por va oral para la diabetes. La hipoglucemia es Investment banker, corporateuna afeccin potencialmente  mortal. Los sntomas de la hipoglucemia (somnolencia, mareos y confusin) son similares a los sntomas de haber consumido demasiado alcohol. Si el mdico afirma que el alcohol es seguro para usted, [place] estas pautas:  Limite el consumo de alcohol a no ms de por da si es mujer y no est [place], y a si es hombre. Una medida equivale a 12oz ( ) de cerveza, 5oz ( ) de vino o 1oz (41ml) de bebidas alcohlicas de alta graduacin.  No beba con el estmago vaco.  Mantngase hidratado bebiendo agua, refrescos dietticos o t helado sin azcar.  Tenga en cuenta que los refrescos comunes, los jugos y otras bebida para Engineer, manufacturing pueden contener mucha azcar y se deben contar como carbohidratos. Cules son algunos consejos para seguir este plan?  Leer las etiquetas de los alimentos  Comience por leer el tamao de la porcin en la Informacin nutricional en las etiquetas de los alimentos envasados y las bebidas. La cantidad de caloras, carbohidratos, grasas y otros nutrientes mencionados en la etiqueta se basan en una porcin del alimento. Muchos alimentos contienen ms de una porcin por envase.  Verifique la cantidad total de gramos (g) de carbohidratos totales en una porcin. Puede calcular la cantidad de porciones de carbohidratos al dividir el total de carbohidratos por 15. Por ejemplo, si un alimento tiene un total de 30g de carbohidratos, equivale a 2 porciones de carbohidratos.  Verifique la cantidad de gramos (g) de grasas saturadas y grasas trans en una porcin. Escoja alimentos que no contengan grasa o que  tengan un bajo contenido.  Verifique la cantidad de miligramos (mg) de sal (sodio) en una porcin. La [employer] de las personas deben limitar la ingesta de sodio total a menos de 2300mg  por .  Siempre consulte la informacin nutricional de los alimentos etiquetados como con bajo contenido de Futures trader o sin grasa. Estos alimentos pueden tener un mayor contenido de [place] agregada o carbohidratos refinados, y deben evitarse.  Hable con su nutricionista para identificar sus objetivos diarios en cuanto a los nutrientes mencionados en la etiqueta. Al ir de compras  Evite comprar alimentos procesados, enlatados o precocinados. Estos alimentos tienden a International aid/development worker mayor cantidad de [place], sodio y azcar agregada.  Compre en la zona exterior de la tienda de comestibles. Esta zona incluye frutas y verduras frescas, granos a granel, carnes frescas y productos lcteos frescos. Al cocinar  Utilice mtodos de coccin a baja temperatura, como hornear, en lugar de mtodos de coccin a alta temperatura, como frer en abundante aceite.  Cocine con aceites saludables, como el aceite de [place], canola o [place].  Evite cocinar con manteca, crema o carnes con alto contenido de grasa. Planificacin de las comidas  Coma las comidas y los refrigerios regularmente, preferentemente a la misma hora todos [place]. Evite pasar largos perodos de tiempo sin comer.  Consuma alimentos ricos en fibra, como frutas frescas, verduras, frijoles y cereales integrales. Consulte a su nutricionista sobre cuntas porciones de carbohidratos puede consumir en cada comida.  Consuma entre 4 y 6 onzas (oz) de protenas magras por da, como carnes [place], pollo, pescado, huevos o tofu. Una onza de protena magra equivale a: ? 1 onza de carne, pollo o pescado. ? 1huevo. ?  taza de tofu.  Coma algunos alimentos por da que contengan grasas saludables, como aguacates, frutos secos, semillas y pescado. Estilo de vida  Controle su  nivel de glucemia con regularidad.  Haga actividad fsica habitualmente como se lo haya indicado el mdico. Esto  puede incluir lo siguiente: ? semanales de ejercicio de intensidad moderada o alta. Esto podra incluir caminatas dinmicas, ciclismo o gimnasia acutica. ? Realizar ejercicios de elongacin y de fortalecimiento, como yoga o levantamiento de pesas, por lo menos 2veces por semana.  Tome los Monsanto Company se lo haya indicado el mdico.  No consuma ningn producto que contenga nicotina o tabaco, como cigarrillos y Administrator, Civil Service. Si necesita ayuda para dejar de fumar, consulte al CIGNA con un asesor o instructor en diabetes para identificar estrategias para controlar el estrs y cualquier desafo emocional y social. Preguntas para hacerle al mdico  Es necesario que consulte a IT trainer en el cuidado de la diabetes?  Es necesario que me rena con un nutricionista?  A qu nmero puedo llamar si tengo preguntas?  Cules son los mejores momentos para controlar la glucemia? Dnde encontrar ms informacin:  Asociacin Estadounidense de la Diabetes (American Diabetes Association): diabetes.org  Academia de Nutricin y Pension scheme manager (Academy of Nutrition and Dietetics): www.eatright.org  [employer] de la Diabetes y las Enfermedades Digestivas y [facility] of Diabetes and Digestive and Kidney Diseases, NIH): CarFlippers.tn Resumen  Un plan de alimentacin saludable lo ayudar a Scientist, physiological glucemia y Pharmacologist un estilo de vida saludable.  Trabajar con un especialista en dietas y nutricin (nutricionista) puede ayudarlo a Designer, television/film set de alimentacin para usted.  Tenga en cuenta que los carbohidratos (hidratos de carbono) y el alcohol tienen efectos inmediatos en sus niveles de glucemia. Es importante contar los carbohidratos que ingiere y consumir alcohol con prudencia. Esta informacin no tiene Public house manager el consejo del mdico. Asegrese de hacerle al mdico cualquier pregunta que tenga. Document Revised: 04/27/2017 Document Reviewed: 12/07/2016 Elsevier Patient Education  2020 Elsevier Inc.      Edwina Barth, MD [facility]

## 2020-03-20 LAB — COMPREHENSIVE METABOLIC PANEL
ALT: 28 IU/L (ref 0–44)
AST: 20 IU/L (ref 0–40)
Albumin/Globulin Ratio: 1.8 (ref 1.2–2.2)
Albumin: 4.6 g/dL (ref 3.8–4.8)
Alkaline Phosphatase: 71 IU/L (ref 48–121)
BUN/Creatinine Ratio: 28 — ABNORMAL HIGH (ref 10–24)
BUN: 19 mg/dL (ref 8–27)
Bilirubin Total: 0.3 mg/dL (ref 0.0–1.2)
CO2: 26 mmol/L (ref 20–29)
Calcium: 9.7 mg/dL (ref 8.6–10.2)
Chloride: 102 mmol/L (ref 96–106)
Creatinine, Ser: 0.69 mg/dL — ABNORMAL LOW (ref 0.76–1.27)
GFR calc Af Amer: 118 mL/min/{1.73_m2} (ref >59)
GFR calc non Af Amer: 102 mL/min/{1.73_m2} (ref >59)
Globulin, Total: 2.5 g/dL (ref 1.5–4.5)
Glucose: 113 mg/dL — ABNORMAL HIGH (ref 65–99)
Potassium: 4.3 mmol/L (ref 3.5–5.2)
Sodium: 139 mmol/L (ref 134–144)
Total Protein: 7.1 g/dL (ref 6.0–8.5)

## 2020-03-20 LAB — LIPID PANEL
Chol/HDL Ratio: 2.4 ratio (ref 0.0–5.0)
Cholesterol, Total: 160 mg/dL (ref 100–199)
HDL: 66 mg/dL (ref >39)
LDL Chol Calc (NIH): 82 mg/dL (ref 0–99)
Triglycerides: 62 mg/dL (ref 0–149)
VLDL Cholesterol Cal: 12 mg/dL (ref 5–40)

## 2020-04-18 ENCOUNTER — Other Ambulatory Visit: Payer: Self-pay | Admitting: Emergency Medicine

## 2020-04-18 DIAGNOSIS — I10 Essential (primary) hypertension: Secondary | ICD-10-CM

## 2020-04-19 NOTE — Telephone Encounter (Signed)
Requested medication (s) are due for refill today: Yes  Requested medication (s) are on the active medication list: Yes  Last refill:  03/22/19  Future visit scheduled: Yes  Notes to clinic:  Prescription has expired.    Requested Prescriptions  Pending Prescriptions Disp Refills   amLODipine (NORVASC) 5 MG tablet [Pharmacy Med Name: amLODIPine Besylate 5 MG Oral Tablet] 30 tablet 0    Sig: Take 1 tablet by mouth once daily      Cardiovascular:  Calcium Channel Blockers Passed - 04/18/2020  7:24 PM      Passed - Last BP in normal range    BP Readings from Last 1 Encounters:  03/19/20 124/71          Passed - Valid encounter within last 6 months    Recent Outpatient Visits           1 month ago Hypertension associated with diabetes [facility])   [facility] at [place], Eilleen Kempf, MD   7 months ago Hypertension associated with diabetes [facility])   Primary Care at [place], Eilleen Kempf, MD   1 year ago Essential hypertension   Primary Care at [place], Eilleen Kempf, MD   1 year ago Routine general medical examination at a health care facility   [facility] at [facility], Eilleen Kempf, MD   1 year ago Essential hypertension   Primary Care at [facility], Eilleen Kempf, MD       Future Appointments             In 5 months [place], Eilleen Kempf, MD Primary Care at [place], [facility]

## 2020-09-17 ENCOUNTER — Ambulatory Visit: Payer: 59 | Admitting: Emergency Medicine

## 2020-09-30 ENCOUNTER — Ambulatory Visit: Payer: 59 | Admitting: Emergency Medicine

## 2020-09-30 ENCOUNTER — Other Ambulatory Visit: Payer: Self-pay

## 2020-09-30 ENCOUNTER — Encounter: Payer: Self-pay | Admitting: Emergency Medicine

## 2020-09-30 VITALS — BP 132/75 | HR 77 | Temp 97.9°F | Resp 16 | Ht 70.0 in | Wt 186.0 lb

## 2020-09-30 DIAGNOSIS — Z125 Encounter for screening for malignant neoplasm of prostate: Secondary | ICD-10-CM

## 2020-09-30 DIAGNOSIS — Z20822 Contact with and (suspected) exposure to covid-19: Secondary | ICD-10-CM

## 2020-09-30 DIAGNOSIS — Z789 Other specified health status: Secondary | ICD-10-CM

## 2020-09-30 DIAGNOSIS — E1165 Type 2 diabetes mellitus with hyperglycemia: Secondary | ICD-10-CM | POA: Diagnosis not present

## 2020-09-30 DIAGNOSIS — R399 Unspecified symptoms and signs involving the genitourinary system: Secondary | ICD-10-CM

## 2020-09-30 DIAGNOSIS — E1159 Type 2 diabetes mellitus with other circulatory complications: Secondary | ICD-10-CM

## 2020-09-30 DIAGNOSIS — I152 Hypertension secondary to endocrine disorders: Secondary | ICD-10-CM

## 2020-09-30 LAB — POCT GLYCOSYLATED HEMOGLOBIN (HGB A1C): Hemoglobin A1C: 7 % — AB (ref 4.0–5.6)

## 2020-09-30 LAB — GLUCOSE, POCT (MANUAL RESULT ENTRY): POC Glucose: 135 mg/dl — AB (ref 70–99)

## 2020-09-30 MED ORDER — TAMSULOSIN HCL 0.4 MG PO CAPS: 0.4000 mg | ORAL_CAPSULE | Freq: Every day | ORAL | 3 refills | Status: DC

## 2020-09-30 NOTE — Patient Instructions (Addendum)
   If you have lab work done today you will be contacted with your lab results within the next 2 weeks.  If you have not heard from us then please contact us. The fastest way to get your results is to register for My Chart.   IF you received an x-ray today, you will receive an invoice from [facility]. Please contact [facility] at [phone] with questions or concerns regarding your invoice.   IF you received labwork today, you will receive an invoice from [facility]. Please contact [facility] at [phone] with questions or concerns regarding your invoice.   Our billing staff will not be able to assist you with questions regarding bills from these companies.  You will be contacted with the lab results as soon as they are available. The fastest way to get your results is to activate your My Chart account. Instructions are located on the last page of this paperwork. If you have not heard from us regarding the results in 2 weeks, please contact this office.     Diabetes mellitus y nutricin, en adultos Diabetes Mellitus and Nutrition, Adult Si sufre de diabetes, o diabetes mellitus, es muy importante tener hbitos alimenticios saludables debido a que sus niveles de azcar en la sangre (glucosa) se ven afectados en gran medida por lo que come y bebe. [place] alimentos saludables en las cantidades correctas, aproximadamente a la misma hora todos los das, lo ayudar a:  Controlar la glucemia.  Disminuir el riesgo de sufrir una enfermedad cardaca.  Mejorar la presin arterial.  Alcanzar o mantener un peso saludable. Qu puede afectar mi plan de alimentacin? Todas las personas que sufren de diabetes son diferentes y cada una tiene necesidades diferentes en cuanto a un plan de alimentacin. El mdico puede recomendarle que trabaje con un nutricionista para elaborar el mejor plan para usted. Su plan de alimentacin puede variar segn factores como:  Las caloras que  necesita.  Los medicamentos que toma.  Su peso.  Sus niveles de glucemia, presin arterial y colesterol.  Su nivel de actividad.  Otras afecciones que tenga, como enfermedades cardacas o renales. Cmo me afectan los carbohidratos? Los carbohidratos, o hidratos de carbono, afectan su nivel de glucemia ms que cualquier otro tipo de alimento. La ingesta de carbohidratos naturalmente aumenta la cantidad de glucosa en la sangre. El recuento de carbohidratos es un mtodo destinado a llevar un registro de la cantidad de carbohidratos que se consumen. El recuento de carbohidratos es importante para mantener la glucemia a un nivel saludable, especialmente si utiliza insulina o toma determinados medicamentos por va oral para la diabetes. Es importante conocer la cantidad de carbohidratos que se pueden ingerir en cada comida sin correr ningn riesgo. Esto es diferente en cada persona. Su nutricionista puede ayudarlo a calcular la cantidad de carbohidratos que debe ingerir en cada comida y en cada refrigerio. Cmo me afecta el alcohol? El alcohol puede provocar disminuciones sbitas de la glucemia (hipoglucemia), especialmente si utiliza insulina o toma determinados medicamentos por va oral para la diabetes. La hipoglucemia es una afeccin potencialmente mortal. Los sntomas de la hipoglucemia, como somnolencia, mareos y confusin, son similares a los sntomas de haber consumido demasiado alcohol.  No beba alcohol si: ? Su mdico le indica no hacerlo. ? Est embarazada, puede estar embarazada o est tratando de quedar embarazada.  Si bebe alcohol: ? No beba con el estmago vaco. ? Limite la cantidad que bebe:  De 0 a 1 medida por da para las   mujeres.  De 0 a 2 medidas por da para los hombres. ? Est atento a la cantidad de alcohol que hay en las bebidas que toma. En [place], una medida equivale a una botella de cerveza de 12oz (355ml), un vaso de vino de 5oz (148ml) o un vaso  de una bebida alcohlica de alta graduacin de 1oz (44ml). ? Mantngase hidratado bebiendo agua, refrescos dietticos o t helado sin azcar.  Tenga en cuenta que los refrescos comunes, los jugos y otras bebida para mezclar pueden contener mucha azcar y se deben contar como carbohidratos. Consejos para seguir este plan Leer las etiquetas de los alimentos  Comience por leer el tamao de la porcin en la "Informacin nutricional" en las etiquetas de los alimentos envasados y las bebidas. La cantidad de caloras, carbohidratos, grasas y otros nutrientes mencionados en la etiqueta se basan en una porcin del alimento. Muchos alimentos contienen ms de una porcin por envase.  Verifique la cantidad total de gramos (g) de carbohidratos totales en una porcin. Puede calcular la cantidad de porciones de carbohidratos al dividir el total de carbohidratos por 15. Por ejemplo, si un alimento tiene un total de 30g de carbohidratos totales por porcin, equivale a 2 porciones de carbohidratos.  Verifique la cantidad de gramos (g) de grasas saturadas y grasas trans de una porcin. Escoja alimentos que no contengan estas grasas o que su contenido de estas sea bajo.  Verifique la cantidad de miligramos (mg) de sal (sodio) en una porcin. La mayora de las personas deben limitar la ingesta de sodio total a menos de 2300mg por da.  Siempre consulte la informacin nutricional de los alimentos etiquetados como "con bajo contenido de grasa" o "sin grasa". Estos alimentos pueden tener un mayor contenido de azcar agregada o carbohidratos refinados, y deben evitarse.  Hable con su nutricionista para identificar sus objetivos diarios en cuanto a los nutrientes mencionados en la etiqueta. Al ir de compras  Evite comprar alimentos procesados, enlatados o precocidos. Estos alimentos tienden a tener una mayor cantidad de grasa, sodio y azcar agregada.  Compre en la zona exterior de la tienda de comestibles. Esta es  la zona donde se encuentran con mayor frecuencia las frutas y las verduras frescas, los cereales a granel, las carnes frescas y los productos lcteos frescos. Al cocinar  Utilice mtodos de coccin a baja temperatura, como hornear, en lugar de mtodos de coccin a alta temperatura, como frer en abundante aceite.  Cocine con aceites saludables, como el aceite de oliva, canola o girasol.  Evite cocinar con manteca, crema o carnes con alto contenido de grasa. Planificacin de las comidas  Coma las comidas y los refrigerios regularmente, preferentemente a la misma hora todos los das. Evite pasar largos perodos de tiempo sin comer.  Consuma alimentos ricos en fibra, como frutas frescas, verduras, frijoles y cereales integrales. Consulte a su nutricionista sobre cuntas porciones de carbohidratos puede consumir en cada comida.  Consuma entre 4 y 6 onzas (entre 112 y 168g) de protenas magras por da, como carnes magras, pollo, pescado, huevos o tofu. Una onza (oz) de protena magra equivale a: ? 1 onza (28g) de carne, pollo o pescado. ? 1huevo. ?  de taza (62 g) de tofu.  Coma algunos alimentos por da que contengan grasas saludables, como aguacates, frutos secos, semillas y pescado.   Qu alimentos debo comer? Frutas Bayas. Manzanas. Naranjas. Duraznos. Damascos. Ciruelas. Uvas. Mango. Papaya. Granada. Kiwi. Cerezas. Verduras Lechuga. Espinaca. Verduras de hoja verde, que incluyen   col rizada, acelga, hojas de berza y de mostaza. Remolachas. Coliflor. Repollo. Brcoli. Zanahorias. Judas verdes. Tomates. Pimientos. Cebollas. Pepinos. Coles de Bruselas. Granos Granos integrales, como panes, galletas, tortillas, cereales y pastas de salvado o integrales. Avena sin azcar. Quinua. Arroz integral o salvaje. Carnes y otras protenas Mariscos. Carne de ave sin piel. Cortes magros de ave y carne de res. Tofu. Frutos secos. Semillas. Lcteos Productos lcteos sin grasa o con bajo contenido de  grasa, como leche, yogur y queso. Es posible que los productos que se enumeran ms arriba no constituyan una lista completa de los alimentos y las bebidas que puede tomar. Consulte a un nutricionista para obtener ms informacin. Qu alimentos debo evitar? Frutas Frutas enlatadas al almbar. Verduras Verduras enlatadas. Verduras congeladas con mantequilla o salsa de crema. Granos Productos elaborados con harina y harina blanca refinada, como panes, pastas, bocadillos y cereales. Evite todos los alimentos procesados. Carnes y otras protenas Cortes de carne con alto contenido de grasa. Carne de ave con piel. Carnes empanizadas o fritas. Carne procesada. Evite las grasas saturadas. Lcteos Yogur, queso o leche enteros. Bebidas Bebidas azucaradas, como gaseosas o t helado. Es posible que los productos que se enumeran ms arriba no constituyan una lista completa de los alimentos y las bebidas que debe evitar. Consulte a un nutricionista para obtener ms informacin. Preguntas para hacerle al mdico  Es necesario que me rena con un instructor en el cuidado de la diabetes?  Es necesario que me rena con un nutricionista?  A qu nmero puedo llamar si tengo preguntas?  Cules son los mejores momentos para controlar la glucemia? Dnde encontrar ms informacin:  Asociacin Estadounidense de la Diabetes (American Diabetes Association): diabetes.org  Academy of Nutrition and Dietetics (Academia de Nutricin y Diettica): www.eatright.org  National Institute of Diabetes and Digestive and Kidney Diseases (Instituto Nacional de la Diabetes y las Enfermedades Digestivas y Renales): www.niddk.nih.gov  Association of Diabetes Care and Education Specialists (Asociacin de Especialistas en Atencin y Educacin sobre la Diabetes): www.diabeteseducator.org Resumen  Es importante tener hbitos alimenticios saludables debido a que sus niveles de azcar en la sangre (glucosa) se ven afectados en  gran medida por lo que come y bebe.  Un plan de alimentacin saludable lo ayudar a controlar la glucemia y mantener un estilo de vida saludable.  El mdico puede recomendarle que trabaje con un nutricionista para elaborar el mejor plan para usted.  Tenga en cuenta que los carbohidratos (hidratos de carbono) y el alcohol tienen efectos inmediatos en sus niveles de glucemia. Es importante contar los carbohidratos que ingiere y consumir alcohol con prudencia. Esta informacin no tiene como fin reemplazar el consejo del mdico. Asegrese de hacerle al mdico cualquier pregunta que tenga. Document Revised: 09/21/2019 Document Reviewed: 09/21/2019 Elsevier Patient Education  2021 [employer].  

## 2020-09-30 NOTE — Progress Notes (Signed)
Erik Mcgee 65 y.o.   Chief Complaint  Patient presents with  . Diabetes  . Hypertension    Follow up    HISTORY OF PRESENT ILLNESS: This is a 65 y.o. male with history of hypertension and diabetes here for follow-up. Taking amlodipine 5 mg daily for hypertension. Eating better and exercising more.  Took himself off Tradjenta couple months ago. Intolerant to Metformin and statins. Not vaccinated against Covid. Also complaining of lower urinary tract symptoms. No other complaints or medical concerns today.  HPI   Prior to Admission medications   Medication Sig Start Date End Date Taking? Authorizing Provider  tamsulosin (FLOMAX) 0.4 MG CAPS capsule Take 1 capsule (0.4 mg total) by mouth daily. 09/30/20  Yes Erik Quint, MD  amLODipine (NORVASC) 5 MG tablet Take 1 tablet by mouth once daily 04/19/20   Erik Quint, MD  linagliptin (TRADJENTA) 5 MG TABS tablet Take 1 tablet (5 mg total) by mouth daily. 04/11/19 07/10/19  Erik Quint, MD  rosuvastatin (CRESTOR) 5 MG tablet Take 1 tablet (5 mg total) by mouth daily. Patient not taking: Reported on 09/30/2020 09/20/19   Erik Quint, MD  tadalafil (ADCIRCA/CIALIS) 20 MG tablet Take 0.5-1 tablets (10-20 mg total) by mouth every other day as needed for erectile dysfunction. Patient not taking: Reported on 09/30/2020 07/05/18   Erik Quint, MD    No Known Allergies  Patient Active Problem List   Diagnosis Date Noted  . Statin intolerance 03/19/2020  . Snoring 07/05/2018  . Erectile dysfunction 07/05/2018  . History of gastroesophageal reflux (GERD) 09/10/2017  . History of hyperglycemia 07/28/2017  . Hypertension associated with diabetes (HCC) 07/28/2017    Past Medical History:  Diagnosis Date  . Hypertension     History reviewed. No pertinent surgical history.  Social History   Socioeconomic History  . Marital status: Married    Spouse name: Not on file  . Number of children:  Not on file  . Years of education: Not on file  . Highest education level: Not on file  Occupational History  . Not on file  Tobacco Use  . Smoking status: Never Smoker  . Smokeless tobacco: Never Used  Substance and Sexual Activity  . Alcohol use: No  . Drug use: No  . Sexual activity: Not on file  Other Topics Concern  . Not on file  Social History Narrative  . Not on file   Social Determinants of Health   Financial Resource Strain: Not on file  Food Insecurity: Not on file  Transportation Needs: Not on file  Physical Activity: Not on file  Stress: Not on file  Social Connections: Not on file  Intimate Partner Violence: Not on file    Family History  Problem Relation Age of Onset  . Cancer Mother        melamona  . Heart disease Father      Review of Systems  Constitutional: Negative.  Negative for chills and fever.  HENT: Negative.  Negative for congestion and sore throat.   Respiratory: Negative.  Negative for cough and shortness of breath.   Cardiovascular: Negative.  Negative for chest pain and palpitations.  Gastrointestinal: Negative.  Negative for abdominal pain, blood in stool, diarrhea, melena, nausea and vomiting.  Genitourinary: Positive for frequency. Negative for hematuria.       Nocturia Decreased urinary flow and force  Musculoskeletal: Negative.  Negative for myalgias and neck pain.  Skin: Negative.  Negative for rash.  Neurological: Negative for dizziness and headaches.  All other systems reviewed and are negative.  Today's Vitals   09/30/20 1517  BP: 132/75  Pulse: 77  Resp: 16  Temp: 97.9 F (36.6 C)  TempSrc: Temporal  SpO2: 97%  Weight: 186 lb (84.4 kg)  Height: 5\' 10"  (1.778 m)   Body mass index is 26.69 kg/m.   Physical Exam Vitals reviewed.  Constitutional:      Appearance: Normal appearance.  HENT:     Head: Normocephalic.  Eyes:     Extraocular Movements: Extraocular movements intact.     Pupils: Pupils are equal,  round, and reactive to light.  Cardiovascular:     Rate and Rhythm: Normal rate and regular rhythm.     Pulses: Normal pulses.     Heart sounds: Normal heart sounds.  Pulmonary:     Effort: Pulmonary effort is normal.     Breath sounds: Normal breath sounds.  Abdominal:     Palpations: Abdomen is soft.     Tenderness: There is no abdominal tenderness.  Musculoskeletal:        General: Normal range of motion.     Cervical back: Normal range of motion and neck supple.  Skin:    General: Skin is warm and dry.     Capillary Refill: Capillary refill takes less than 2 seconds.  Neurological:     General: No focal deficit present.     Mental Status: He is alert and oriented to person, place, and time.  Psychiatric:        Mood and Affect: Mood normal.        Behavior: Behavior normal.    Results for orders placed or performed in visit on 09/30/20 (from the past 24 hour(s))  POCT glucose (manual entry)     Status: Abnormal   Collection Time: 09/30/20  4:13 PM  Result Value Ref Range   POC Glucose 135 (A) 70 - 99 mg/dl  POCT glycosylated hemoglobin (Hb A1C)     Status: Abnormal   Collection Time: 09/30/20  4:18 PM  Result Value Ref Range   Hemoglobin A1C 7.0 (A) 4.0 - 5.6 %   HbA1c POC (<> result, manual entry)     HbA1c, POC (prediabetic range)     HbA1c, POC (controlled diabetic range)       ASSESSMENT & PLAN: Hypertension associated with diabetes (HCC) Well-controlled hypertension.  Continue present medication. Uncontrolled diabetes with hemoglobin A1c at 7.0.  Restart Tradjenta 5 mg daily. Diet and nutrition discussed. Follow-up in 3 to 6 months.  [name] was seen today for diabetes and hypertension.  Diagnoses and all orders for this visit:  Hypertension associated with diabetes (HCC) -     Comprehensive metabolic panel -     Hemoglobin A1c -     Ambulatory referral to Ophthalmology -     Microalbumin, urine  Type 2 diabetes mellitus with hyperglycemia, without  long-term current use of insulin (HCC) -     POCT glucose (manual entry) -     POCT glycosylated hemoglobin (Hb A1C)  Statin intolerance  Exposure to COVID-19 virus -     SAR CoV2 Serology (COVID 19)AB(IGG)IA  Prostate cancer screening -     PSA  Lower urinary tract symptoms -     tamsulosin (FLOMAX) 0.4 MG CAPS capsule; Take 1 capsule (0.4 mg total) by mouth daily.    Patient Instructions       If you have lab work done today you will be  contacted with your lab results within the next 2 weeks.  If you have not heard from [place] then please contact us. The fastest way to get your results is to register for My Chart.   IF you received an x-ray today, you will receive an invoice from [facility]. Please contact [facility] at [phone] with questions or concerns regarding your invoice.   IF you received labwork today, you will receive an invoice from [place]. Please contact [facility] at [phone] with questions or concerns regarding your invoice.   Our billing staff will not be able to assist you with questions regarding bills from these companies.  You will be contacted with the lab results as soon as they are available. The fastest way to get your results is to activate your My Chart account. Instructions are located on the last page of this paperwork. If you have not heard from [place] regarding the results in 2 weeks, please contact this office.     Diabetes mellitus y nutricin, en adultos Diabetes Mellitus and Nutrition, Adult Si sufre de diabetes, o diabetes mellitus, es muy importante tener hbitos alimenticios saludables debido a que sus niveles de Psychologist, counselling sangre (glucosa) se ven afectados en gran medida por lo que come y bebe. [place] alimentos saludables en las cantidades correctas, aproximadamente a la misma hora todos los [place], [place] ayudar a:  Scientist, physiological glucemia.  Disminuir el riesgo de sufrir una enfermedad cardaca.  Mejorar la presin  arterial.  Barista o mantener un peso saludable. Qu puede afectar mi plan de alimentacin? Todas las personas que sufren de diabetes son diferentes y cada una tiene necesidades diferentes en cuanto a un plan de alimentacin. El mdico puede recomendarle que trabaje con un nutricionista para elaborar el mejor plan para usted. Su plan de alimentacin puede variar segn factores como:  Las caloras que necesita.  Los medicamentos que toma.  Su peso.  Sus niveles de glucemia, presin arterial y colesterol.  Su nivel de [place].  Otras afecciones que tenga, como enfermedades cardacas o renales. Cmo me afectan los carbohidratos? Los carbohidratos, o hidratos de carbono, afectan su nivel de glucemia ms que cualquier otro tipo de alimento. La ingesta de carbohidratos naturalmente aumenta la cantidad de [employer]. El recuento de carbohidratos es un mtodo destinado a Midwife un registro de la cantidad de carbohidratos que se consumen. El recuento de carbohidratos es importante para Pharmacologist la glucemia a un nivel saludable, especialmente si utiliza insulina o toma determinados medicamentos por va oral para la diabetes. Es importante conocer la cantidad de carbohidratos que se pueden ingerir en cada comida sin correr Surveyor, minerals. Esto es Government social research officer. Su nutricionista puede ayudarlo a calcular la cantidad de carbohidratos que debe ingerir en cada comida y en cada refrigerio. Cmo me afecta el alcohol? El alcohol puede provocar disminuciones sbitas de la glucemia (hipoglucemia), especialmente si utiliza insulina o toma determinados medicamentos por va oral para la diabetes. La hipoglucemia es una afeccin potencialmente mortal. Los sntomas de la hipoglucemia, como somnolencia, mareos y confusin, son similares a los sntomas de haber consumido demasiado alcohol.  No beba alcohol si: ? Su mdico le indica no hacerlo. ? Est embarazada, puede estar embarazada o est  tratando de quedar embarazada.  Si bebe alcohol: ? No beba con el estmago vaco. ? Limite la cantidad que bebe:  De 0 a 1 medida por da para las mujeres.  De 0 a 2 medidas por da para los hombres. ?  Est atento a la cantidad de alcohol que hay en las bebidas que toma. En los Hypericum, una medida equivale a una botella de cerveza de 12oz ( ), un vaso de vino de 5oz ( ) o un vaso de una bebida alcohlica de alta graduacin de 1oz (74ml). ? Mantngase hidratado bebiendo agua, refrescos dietticos o t helado sin azcar.  Tenga en cuenta que los refrescos comunes, los jugos y otras bebida para Engineer, manufacturing pueden contener mucha azcar y se deben contar como carbohidratos. Consejos para seguir Consulting civil engineer las etiquetas de los alimentos  Comience por leer el tamao de la porcin en la "Informacin nutricional" en las etiquetas de los alimentos envasados y las bebidas. La cantidad de caloras, carbohidratos, grasas y otros nutrientes mencionados en la etiqueta se basan en una porcin del alimento. Muchos alimentos contienen ms de una porcin por envase.  Verifique la cantidad total de gramos (g) de carbohidratos totales en una porcin. Puede calcular la cantidad de porciones de carbohidratos al dividir el total de carbohidratos por 15. Por ejemplo, si un alimento tiene un total de 30g de carbohidratos totales por porcin, equivale a 2 porciones de carbohidratos.  Verifique la cantidad de gramos (g) de grasas saturadas y grasas trans de una porcin. Escoja alimentos que no contengan estas grasas o que su contenido de estas sea [place].  Verifique la cantidad de miligramos (mg) de sal (sodio) en una porcin. [place] [employer] de las personas deben limitar la ingesta de sodio total a menos de 2300mg  por .  Siempre consulte la informacin nutricional de los alimentos etiquetados como "con bajo contenido de grasa" o "sin grasa". Estos alimentos pueden tener un mayor contenido de Futures trader  agregada o carbohidratos refinados, y deben evitarse.  Hable con su nutricionista para identificar sus objetivos diarios en cuanto a los nutrientes mencionados en la etiqueta. Al ir de compras  Evite comprar alimentos procesados, enlatados o precocidos. Estos alimentos tienden a International aid/development worker mayor cantidad de [place], sodio y azcar agregada.  Compre en la zona exterior de la tienda de comestibles. Esta es la zona donde se encuentran con mayor frecuencia las frutas y las verduras frescas, los cereales a granel, las carnes frescas y los productos lcteos frescos. Al cocinar  Utilice mtodos de coccin a baja temperatura, como hornear, en lugar de mtodos de coccin a alta temperatura, como frer en abundante aceite.  Cocine con aceites saludables, como el aceite de [place], canola o [place].  Evite cocinar con manteca, crema o carnes con alto contenido de grasa. Planificacin de las comidas  Coma las comidas y los refrigerios regularmente, preferentemente a la misma hora todos [place]. Evite pasar largos perodos de tiempo sin comer.  Consuma alimentos ricos en fibra, como frutas frescas, verduras, frijoles y cereales integrales. Consulte a su nutricionista sobre cuntas porciones de carbohidratos puede consumir en cada comida.  Consuma entre 4 y 6 onzas (entre 112 y 168g) de protenas magras por da, como carnes magras, pollo, pescado, huevos o tofu. Una onza (oz) de protena magra equivale a: ? 1 onza (28g) de carne, pollo o pescado. ? 1huevo. ?  de taza (62 g) de tofu.  Coma algunos alimentos por da que contengan grasas saludables, como aguacates, frutos secos, semillas y pescado.   Qu alimentos debo comer? [place]. Manzanas. Naranjas. Duraznos. Damascos. Ciruelas. Uvas. Mango. Papaya. Gran[place]. Kiwi. Cerezas. Nils Pyle Hoover Brunette. Espinaca. Verduras de [place], que incluyen col rizada, [place], hojas de [place] y de [place]. Remolachas. Coliflor. Repollo. Brcoli. Zanahorias.  Judas verdes. Tomates. Pimientos. Cebollas. Pepinos. Coles de Bruselas. Granos Granos integrales, como panes, galletas, tortillas, cereales y pastas de salvado o integrales. Avena sin azcar. Quinua. Arroz integral o salvaje. Carnes y Warehouse managerotras protenas Mariscos. Carne de ave sin piel. Cortes magros de ave y carne de res. Tofu. Frutos secos. Semillas. Lcteos Productos lcteos sin grasa o con bajo contenido de [place] Lakegrasa, [place]como leche, yogur y [place]. Es posible que los productos que se enumeran ms [place]a no constituyan una lista completa de los alimentos y las bebidas que puede tomar. Consulte a un nutricionista para obtener ms informacin. Qu alimentos debo evitar? Nils PyleFrutas Frutas enlatadas al almbar. Verduras Verduras enlatadas. Verduras congeladas con mantequilla o salsa de crema. Granos Productos elaborados con [place] y [place]ag[place]harina blanca refinada, como panes, pastas, bocadillos y cereales. Evite todos los alimentos procesados. Carnes y [id number]otras protenas Cortes de carne con alto contenido de Holiday representativegrasa. Carne de ave con piel. Carnes empanizadas o fritas. Carne procesada. Evite las grasas saturadas. Lcteos Yogur, queso o Cardinal Healthleche enteros. Bebidas Bebidas azucaradas, como gaseosas o t helado. Es posible que los productos que se enumeran ms [place]riba no constituyan una lista completa de los alimentos y las bebidas que Personnel officerdebe evitar. Consulte a un nutricionista para obtener ms informacin. Preguntas para hacerle al mdico  Es necesario que me rena con IT trainerun instructor en el cuidado de la diabetes?  Es necesario que me rena con un nutricionista?  A qu nmero puedo llamar si tengo preguntas?  Cules son los mejores momentos para controlar la glucemia? Dnde encontrar ms informacin:  Asociacin Estadounidense de la Diabetes (American Diabetes Association): diabetes.org  Academy of Nutrition and Dietetics (Academia de Nutricin y Pension scheme managerDiettica): www.eatright.AK Steel Holding Corporationorg  National Institute of Diabetes and  Digestive and Kidney Diseases [employer](Instituto Nacional de la Diabetes y las Enfermedades Digestivas y Renales): CarFlippers.tnwww.niddk.nih.gov  Association of Diabetes Care and Education Specialists (Asociacin de Especialistas en Atencin y Educacin sobre la Diabetes): www.diabeteseducator.org Resumen  Es importante tener hbitos alimenticios saludables debido a que sus niveles de Psychologist, counsellingazcar en la sangre (glucosa) se ven afectados en gran medida por lo que come y bebe.  Un plan de alimentacin saludable lo ayudar a controlar la glucemia y Pharmacologistmantener un estilo de vida saludable.  El mdico puede recomendarle que trabaje con un nutricionista para elaborar el mejor plan para usted.  Tenga en cuenta que los carbohidratos (hidratos de carbono) y el alcohol tienen efectos inmediatos en sus niveles de glucemia. Es importante contar los carbohidratos que ingiere y consumir alcohol con prudencia. Esta informacin no tiene Theme park managercomo fin reemplazar el consejo del mdico. Asegrese de hacerle al mdico cualquier pregunta que tenga. Document Revised: 09/21/2019 Document Reviewed: 09/21/2019 Elsevier Patient Education  2021 Elsevier Inc.      Edwina BarthMiguel Aiysha Jillson, MD [facility]

## 2020-09-30 NOTE — Assessment & Plan Note (Signed)
Well-controlled hypertension.  Continue present medication. Uncontrolled diabetes with hemoglobin A1c at 7.0.  Restart Tradjenta 5 mg daily. Diet and nutrition discussed. Follow-up in 3 to 6 months.

## 2020-10-01 LAB — COMPREHENSIVE METABOLIC PANEL
ALT: 24 IU/L (ref 0–44)
AST: 18 IU/L (ref 0–40)
Albumin/Globulin Ratio: 2 (ref 1.2–2.2)
Albumin: 4.5 g/dL (ref 3.8–4.8)
Alkaline Phosphatase: 81 IU/L (ref 44–121)
BUN/Creatinine Ratio: 12 (ref 10–24)
BUN: 10 mg/dL (ref 8–27)
Bilirubin Total: 0.3 mg/dL (ref 0.0–1.2)
CO2: 27 mmol/L (ref 20–29)
Calcium: 9.4 mg/dL (ref 8.6–10.2)
Chloride: 103 mmol/L (ref 96–106)
Creatinine, Ser: 0.83 mg/dL (ref 0.76–1.27)
GFR calc Af Amer: 108 mL/min/{1.73_m2} (ref >59)
GFR calc non Af Amer: 94 mL/min/{1.73_m2} (ref >59)
Globulin, Total: 2.3 g/dL (ref 1.5–4.5)
Glucose: 134 mg/dL — ABNORMAL HIGH (ref 65–99)
Potassium: 4.2 mmol/L (ref 3.5–5.2)
Sodium: 141 mmol/L (ref 134–144)
Total Protein: 6.8 g/dL (ref 6.0–8.5)

## 2020-10-01 LAB — PSA: Prostate Specific Ag, Serum: 0.9 ng/mL (ref 0.0–4.0)

## 2020-10-01 LAB — SAR COV2 SEROLOGY (COVID19)AB(IGG),IA
SARS-CoV-2 Semi-Quant IgG Ab: <13 AU/mL (ref <13.0)
SARS-CoV-2 Spike Ab Interp: NEGATIVE

## 2020-10-01 LAB — MICROALBUMIN, URINE: Microalbumin, Urine: 13.2 ug/mL

## 2020-10-02 ENCOUNTER — Telehealth: Payer: Self-pay | Admitting: Emergency Medicine

## 2020-10-02 NOTE — Telephone Encounter (Signed)
Blood results discussed with patient. 

## 2020-12-19 ENCOUNTER — Encounter: Payer: Self-pay | Admitting: Emergency Medicine

## 2021-01-30 ENCOUNTER — Other Ambulatory Visit: Payer: Self-pay

## 2021-01-30 ENCOUNTER — Encounter: Payer: Self-pay | Admitting: Emergency Medicine

## 2021-01-30 ENCOUNTER — Ambulatory Visit: Payer: 59 | Admitting: Emergency Medicine

## 2021-01-30 VITALS — BP 130/84 | HR 74 | Temp 98.4°F | Ht 70.0 in | Wt 181.8 lb

## 2021-01-30 DIAGNOSIS — M7989 Other specified soft tissue disorders: Secondary | ICD-10-CM | POA: Insufficient documentation

## 2021-01-30 DIAGNOSIS — I152 Hypertension secondary to endocrine disorders: Secondary | ICD-10-CM

## 2021-01-30 DIAGNOSIS — E1159 Type 2 diabetes mellitus with other circulatory complications: Secondary | ICD-10-CM

## 2021-01-30 LAB — POCT GLYCOSYLATED HEMOGLOBIN (HGB A1C): Hemoglobin A1C: 6.2 % — AB (ref 4.0–5.6)

## 2021-01-30 NOTE — Assessment & Plan Note (Signed)
Barely palpable bilateral masses.  Will need soft tissue ultrasound of neck.

## 2021-01-30 NOTE — Patient Instructions (Signed)
Diabetes mellitus y nutricin, en adultos Diabetes Mellitus and Nutrition, Adult Si sufre de diabetes, o diabetes mellitus, es muy importante tener hbitos alimenticios saludables debido a que sus niveles de azcar en la sangre (glucosa) se ven afectados en gran medida por lo que come y bebe. Comer alimentos saludables en las cantidades correctas, aproximadamente a la misma hora todos los das, lo ayudar a: Controlar la glucemia. Disminuir el riesgo de sufrir una enfermedad cardaca. Mejorar la presin arterial. Alcanzar o mantener un peso saludable. Qu puede afectar mi plan de alimentacin? Todas las personas que sufren de diabetes son diferentes y cada una tiene necesidades diferentes en cuanto a un plan de alimentacin. El mdico puede recomendarle que trabaje con un nutricionista para elaborar el mejor plan para usted. Su plan de alimentacin puede variar segn factores como: Las caloras que necesita. Los medicamentos que toma. Su peso. Sus niveles de glucemia, presin arterial y colesterol. Su nivel de actividad. Otras afecciones que tenga, como enfermedades cardacas o renales. Cmo me afectan los carbohidratos? Los carbohidratos, o hidratos de carbono, afectan su nivel de glucemia ms que cualquier otro tipo de alimento. La ingesta de carbohidratos naturalmente aumenta la cantidad de glucosa en la sangre. El recuento de carbohidratos es un mtodo destinado a llevar un registro de la cantidad de carbohidratos que se consumen. El recuento de carbohidratos es importante para mantener la glucemia a un nivel saludable, especialmente si utiliza insulina o toma determinados medicamentos por va oral para la diabetes. Es importante conocer la cantidad de carbohidratos que se pueden ingerir en cada comida sin correr ningn riesgo. Esto es diferente en cada persona. Su nutricionista puede ayudarlo a calcular la cantidad de carbohidratos que debe ingerir en cada comida y en cada refrigerio. Cmo  me afecta el alcohol? El alcohol puede provocar disminuciones sbitas de la glucemia (hipoglucemia), especialmente si utiliza insulina o toma determinados medicamentos por va oral para la diabetes. La hipoglucemia es una afeccin potencialmente mortal. Los sntomas de la hipoglucemia, como somnolencia, mareos y confusin, son similares a los sntomas de haber consumido demasiado alcohol. No beba alcohol si: Su mdico le indica no hacerlo. Est embarazada, puede estar embarazada o est tratando de quedar embarazada. Si bebe alcohol: No beba con el estmago vaco. Limite la cantidad que bebe: De 0 a 1 medida por da para las mujeres. De 0 a 2 medidas por da para los hombres. Est atento a la cantidad de alcohol que hay en las bebidas que toma. En [place], una medida equivale a una botella de cerveza de 12 oz (355 ml), un vaso de vino de 5 oz (148 ml) o un vaso de una bebida alcohlica de alta graduacin de 1 oz (44 ml). Mantngase hidratado bebiendo agua, refrescos dietticos o t helado sin azcar. Tenga en cuenta que los refrescos comunes, los jugos y otras bebida para mezclar pueden contener mucha azcar y se deben contar como carbohidratos. Consejos para seguir este plan Leer las etiquetas de los alimentos Comience por leer el tamao de la porcin en la "Informacin nutricional" en las etiquetas de los alimentos envasados y las bebidas. La cantidad de caloras, carbohidratos, grasas y otros nutrientes mencionados en la etiqueta se basan en una porcin del alimento. Muchos alimentos contienen ms de una porcin por envase. Verifique la cantidad total de gramos (g) de carbohidratos totales en una porcin. Puede calcular la cantidad de porciones de carbohidratos al dividir el total de carbohidratos por 15. Por ejemplo, si un alimento tiene un   total de 30 g de carbohidratos totales por porcin, equivale a 2 porciones de carbohidratos. Verifique la cantidad de gramos (g) de grasas  saturadas y grasas trans de una porcin. Escoja alimentos que no contengan estas grasas o que su contenido de estas sea bajo. Verifique la cantidad de miligramos (mg) de sal (sodio) en una porcin. La mayora de las personas deben limitar la ingesta de sodio total a menos de 2300 mg por da. Siempre consulte la informacin nutricional de los alimentos etiquetados como "con bajo contenido de grasa" o "sin grasa". Estos alimentos pueden tener un mayor contenido de azcar agregada o carbohidratos refinados, y deben evitarse. Hable con su nutricionista para identificar sus objetivos diarios en cuanto a los nutrientes mencionados en la etiqueta. Al ir de compras Evite comprar alimentos procesados, enlatados o precocidos. Estos alimentos tienden a tener una mayor cantidad de grasa, sodio y azcar agregada. Compre en la zona exterior de la tienda de comestibles. Esta es la zona donde se encuentran con mayor frecuencia las frutas y las verduras frescas, los cereales a granel, las carnes frescas y los productos lcteos frescos. Al cocinar Utilice mtodos de coccin a baja temperatura, como hornear, en lugar de mtodos de coccin a alta temperatura, como frer en abundante aceite. Cocine con aceites saludables, como el aceite de oliva, canola o girasol. Evite cocinar con manteca, crema o carnes con alto contenido de grasa. Planificacin de las comidas Coma las comidas y los refrigerios regularmente, preferentemente a la misma hora todos los das. Evite pasar largos perodos de tiempo sin comer. Consuma alimentos ricos en fibra, como frutas frescas, verduras, frijoles y cereales integrales. Consulte a su nutricionista sobre cuntas porciones de carbohidratos puede consumir en cada comida. Consuma entre 4 y 6 onzas (entre 112 y 168 g) de protenas magras por da, como carnes magras, pollo, pescado, huevos o tofu. Una onza (oz) de protena magra equivale a: 1 onza (28 g) de carne, pollo o pescado. 1 huevo.  de  taza (62 g) de tofu. Coma algunos alimentos por da que contengan grasas saludables, como aguacates, frutos secos, semillas y pescado. Qu alimentos debo comer? Frutas Bayas. Manzanas. Naranjas. Duraznos. Damascos. Ciruelas. Uvas. Mango. Papaya. Granada. Kiwi. Cerezas. Verduras Lechuga. Espinaca. Verduras de hoja verde, que incluyen col rizada, acelga, hojas de berza y de mostaza. Remolachas. Coliflor. Repollo. Brcoli. Zanahorias. Judas verdes. Tomates. Pimientos. Cebollas. Pepinos. Coles de Bruselas. Granos Granos integrales, como panes, galletas, tortillas, cereales y pastas de salvado o integrales. Avena sin azcar. Quinua. Arroz integral o salvaje. Carnes y otras protenas Mariscos. Carne de ave sin piel. Cortes magros de ave y carne de res. Tofu. Frutos secos. Semillas. Lcteos Productos lcteos sin grasa o con bajo contenido de grasa, como leche, yogur y queso. Es posible que los productos que se enumeran ms arriba no constituyan una lista completa de los alimentos y las bebidas que puede tomar. Consulte a un nutricionista para obtener ms informacin. Qu alimentos debo evitar? Frutas Frutas enlatadas al almbar. Verduras Verduras enlatadas. Verduras congeladas con mantequilla o salsa de crema. Granos Productos elaborados con harina y harina blanca refinada, como panes, pastas, bocadillos y cereales. Evite todos los alimentos procesados. Carnes y otras protenas Cortes de carne con alto contenido de grasa. Carne de ave con piel. Carnes empanizadas o fritas. Carne procesada. Evite las grasas saturadas. Lcteos Yogur, queso o leche enteros. Bebidas Bebidas azucaradas, como gaseosas o t helado. Es posible que los productos que se enumeran ms arriba no constituyan una lista completa de   los alimentos y las bebidas que debe evitar. Consulte a un nutricionista para obtener ms informacin. Preguntas para hacerle al mdico Es necesario que me rena con un instructor en el cuidado  de la diabetes? Es necesario que me rena con un nutricionista? A qu nmero puedo llamar si tengo preguntas? Cules son los mejores momentos para controlar la glucemia? Dnde encontrar ms informacin: Asociacin Estadounidense de la Diabetes (American Diabetes Association): diabetes.org Academy of Nutrition and Dietetics (Academia de Nutricin y Diettica): www.eatright.org National Institute of Diabetes and Digestive and Kidney Diseases (Instituto Nacional de la Diabetes y las Enfermedades Digestivas y Renales): www.niddk.nih.gov Association of Diabetes Care and Education Specialists (Asociacin de Especialistas en Atencin y Educacin sobre la Diabetes): www.diabeteseducator.org Resumen Es importante tener hbitos alimenticios saludables debido a que sus niveles de azcar en la sangre (glucosa) se ven afectados en gran medida por lo que come y bebe. Un plan de alimentacin saludable lo ayudar a controlar la glucemia y mantener un estilo de vida saludable. El mdico puede recomendarle que trabaje con un nutricionista para elaborar el mejor plan para usted. Tenga en cuenta que los carbohidratos (hidratos de carbono) y el alcohol tienen efectos inmediatos en sus niveles de glucemia. Es importante contar los carbohidratos que ingiere y consumir alcohol con prudencia. Esta informacin no tiene como fin reemplazar el consejo del mdico. Asegrese de hacerle al mdico cualquier pregunta que tenga. Document Revised: 09/21/2019 Document Reviewed: 09/21/2019 Elsevier Patient Education  2021 [employer]  

## 2021-01-30 NOTE — Assessment & Plan Note (Signed)
Well-controlled hypertension.  Continue amlodipine 5 mg daily. Well-controlled diabetes with hemoglobin A1c at 6.2.  Off medications. Continue diet, nutrition, and exercise. Follow-up in 6 months.

## 2021-01-30 NOTE — Progress Notes (Signed)
Erik Mcgee 65 y.o.   Chief Complaint  Patient presents with  . Diabetes  . Hypertension    Follow up     HISTORY OF PRESENT ILLNESS: This is a 65 y.o. male with history of diabetes and hypertension here for follow-up. #1 hypertension: On amlodipine 5 mg daily #2 diabetes: Presently not taking any medication.  Eating better and exercising more. Complains of palpable masses on both side of his frontal neck for several weeks.  Denies difficulty swallowing or pain. No other complaints or medical concerns today.  HPI   Prior to Admission medications   Medication Sig Start Date End Date Taking? Authorizing Provider  amLODipine (NORVASC) 5 MG tablet Take 1 tablet by mouth once daily 04/19/20  Yes Isak Sotomayor, Eilleen Kempf, MD  rosuvastatin (CRESTOR) 5 MG tablet Take 1 tablet (5 mg total) by mouth daily. Patient not taking: Reported on 01/30/2021 09/20/19   Georgina Quint, MD  tadalafil (ADCIRCA/CIALIS) 20 MG tablet Take 0.5-1 tablets (10-20 mg total) by mouth every other day as needed for erectile dysfunction. Patient not taking: Reported on 01/30/2021 07/05/18   Georgina Quint, MD    No Known Allergies  Patient Active Problem List   Diagnosis Date Noted  . Statin intolerance 03/19/2020  . Snoring 07/05/2018  . Erectile dysfunction 07/05/2018  . History of gastroesophageal reflux (GERD) 09/10/2017  . History of hyperglycemia 07/28/2017  . Hypertension associated with diabetes (HCC) 07/28/2017    Past Medical History:  Diagnosis Date  . Hypertension     No past surgical history on file.  Social History   Socioeconomic History  . Marital status: Married    Spouse name: Not on file  . Number of children: Not on file  . Years of education: Not on file  . Highest education level: Not on file  Occupational History  . Not on file  Tobacco Use  . Smoking status: Never Smoker  . Smokeless tobacco: Never Used  Substance and Sexual Activity  . Alcohol use: No  .  Drug use: No  . Sexual activity: Not on file  Other Topics Concern  . Not on file  Social History Narrative  . Not on file   Social Determinants of Health   Financial Resource Strain: Not on file  Food Insecurity: Not on file  Transportation Needs: Not on file  Physical Activity: Not on file  Stress: Not on file  Social Connections: Not on file  Intimate Partner Violence: Not on file    Family History  Problem Relation Age of Onset  . Cancer Mother        melamona  . Heart disease Father      Review of Systems  Constitutional: Negative.  Negative for chills and fever.  HENT: Negative.  Negative for congestion and sore throat.   Respiratory: Negative.  Negative for cough and shortness of breath.   Cardiovascular: Negative.  Negative for chest pain and palpitations.  Gastrointestinal: Negative for abdominal pain, diarrhea, nausea and vomiting.  Genitourinary: Negative.  Negative for dysuria and hematuria.  Skin: Negative.  Negative for rash.  Neurological: Negative for dizziness and headaches.  All other systems reviewed and are negative.   Today's Vitals   01/30/21 1543  BP: 130/84  Pulse: 74  Temp: 98.4 F (36.9 C)  TempSrc: Oral  SpO2: 98%  Weight: 181 lb 12.8 oz (82.5 kg)  Height: 5\' 10"  (1.778 m)   Body mass index is 26.09 kg/m. Wt Readings from Last 3 Encounters:  01/30/21 181 lb 12.8 oz (82.5 kg)  09/30/20 186 lb (84.4 kg)  03/19/20 177 lb (80.3 kg)    Physical Exam Vitals reviewed.  Constitutional:      Appearance: Normal appearance.  HENT:     Head: Normocephalic.  Eyes:     Extraocular Movements: Extraocular movements intact.     Conjunctiva/sclera: Conjunctivae normal.     Pupils: Pupils are equal, round, and reactive to light.  Neck:     Vascular: No carotid bruit.  Cardiovascular:     Rate and Rhythm: Normal rate and regular rhythm.     Pulses: Normal pulses.     Heart sounds: Normal heart sounds.  Pulmonary:     Effort: Pulmonary  effort is normal.     Breath sounds: Normal breath sounds.  Abdominal:     Palpations: Abdomen is soft.     Tenderness: There is no abdominal tenderness.  Musculoskeletal:        General: Normal range of motion.     Cervical back: Normal range of motion and neck supple. No tenderness.     Right lower leg: No edema.     Left lower leg: No edema.  Lymphadenopathy:     Cervical: No cervical adenopathy.  Skin:    General: Skin is warm and dry.     Capillary Refill: Capillary refill takes less than 2 seconds.  Neurological:     General: No focal deficit present.     Mental Status: He is alert and oriented to person, place, and time.  Psychiatric:        Mood and Affect: Mood normal.        Behavior: Behavior normal.     Lab Results  Component Value Date   HGBA1C 6.2 (A) 01/30/2021    ASSESSMENT & PLAN: Hypertension associated with diabetes (HCC) Well-controlled hypertension.  Continue amlodipine 5 mg daily. Well-controlled diabetes with hemoglobin A1c at 6.2.  Off medications. Continue diet, nutrition, and exercise. Follow-up in 6 months.  Mass of soft tissue of neck Barely palpable bilateral masses.  Will need soft tissue ultrasound of neck.  Josip was seen today for diabetes and hypertension.  Diagnoses and all orders for this visit:  Hypertension associated with diabetes (HCC) -     POCT glycosylated hemoglobin (Hb A1C) -     Comprehensive metabolic panel -     Lipid panel  Mass of soft tissue of neck -     US Soft Tissue Head/Neck (NON-THYROID); Future    Patient Instructions   Diabetes mellitus y nutricin, en adultos Diabetes Mellitus and Nutrition, Adult Si sufre de diabetes, o diabetes mellitus, es muy importante tener hbitos alimenticios saludables debido a que sus niveles de Psychologist, counselling sangre (glucosa) se ven afectados en gran medida por lo que come y bebe. [place] alimentos saludables en las cantidades correctas, aproximadamente a la misma hora todos  los [place], [place] ayudar a:  Scientist, physiological glucemia.  Disminuir el riesgo de sufrir una enfermedad cardaca.  Mejorar la presin arterial.  Barista o mantener un peso saludable. Qu puede afectar mi plan de alimentacin? Todas las personas que sufren de diabetes son diferentes y cada una tiene necesidades diferentes en cuanto a un plan de alimentacin. El mdico puede recomendarle que trabaje con un nutricionista para elaborar el mejor plan para usted. Su plan de alimentacin puede variar segn factores como:  Las caloras que necesita.  Los medicamentos que toma.  Su peso.  Sus niveles de glucemia, presin arterial  y colesterol.  Su nivel de [place]adinesactividad.  Otras afecciones que tenga, como enfermedades cardacas o renales. Cmo me afectan los carbohidratos? Los carbohidratos, o hidratos de carbono, afectan su nivel de glucemia ms que cualquier otro tipo de alimento. La ingesta de carbohidratos naturalmente aumenta la cantidad de CarMaxglucosa en la sangre. El recuento de carbohidratos es un mtodo destinado a Midwifellevar un registro de la cantidad de carbohidratos que se consumen. El recuento de carbohidratos es importante para Pharmacologistmantener la glucemia a un nivel saludable, especialmente si utiliza insulina o toma determinados medicamentos por va oral para la diabetes. Es importante conocer la cantidad de carbohidratos que se pueden ingerir en cada comida sin correr Surveyor, mineralsningn riesgo. Esto es Government social research officerdiferente en cada persona. Su nutricionista puede ayudarlo a calcular la cantidad de carbohidratos que debe ingerir en cada comida y en cada refrigerio. Cmo me afecta el alcohol? El alcohol puede provocar disminuciones sbitas de la glucemia (hipoglucemia), especialmente si utiliza insulina o toma determinados medicamentos por va oral para la diabetes. La hipoglucemia es una afeccin potencialmente mortal. Los sntomas de la hipoglucemia, como somnolencia, mareos y confusin, son similares a los sntomas de haber  consumido demasiado alcohol.  No beba alcohol si: ? Su mdico le indica no hacerlo. ? Est embarazada, puede estar embarazada o est tratando de quedar embarazada.  Si bebe alcohol: ? No beba con el estmago vaco. ? Limite la cantidad que bebe:  De 0 a 1 medida por da para las mujeres.  De 0 a 2 medidas por da para los hombres. ? Est atento a la cantidad de alcohol que hay en las bebidas que toma. En los [place], una medida equivale a una botella de cerveza de 12oz (355ml), un vaso de vino de 5oz (148ml) o un vaso de una bebida alcohlica de alta graduacin de 1oz (44ml). ? Mantngase hidratado bebiendo agua, refrescos dietticos o t helado sin azcar.  Tenga en cuenta que los refrescos comunes, los jugos y otras bebida para Engineer, manufacturingmezclar pueden contener mucha azcar y se deben contar como carbohidratos. Consejos para seguir Consulting civil engineereste plan Leer las etiquetas de los alimentos  Comience por leer el tamao de la porcin en la "Informacin nutricional" en las etiquetas de los alimentos envasados y las bebidas. La cantidad de caloras, carbohidratos, grasas y otros nutrientes mencionados en la etiqueta se basan en una porcin del alimento. Muchos alimentos contienen ms de una porcin por envase.  Verifique la cantidad total de gramos (g) de carbohidratos totales en una porcin. Puede calcular la cantidad de porciones de carbohidratos al dividir el total de carbohidratos por 15. Por ejemplo, si un alimento tiene un total de 30g de carbohidratos totales por porcin, equivale a 2 porciones de carbohidratos.  Verifique la cantidad de gramos (g) de grasas saturadas y grasas trans de una porcin. Escoja alimentos que no contengan estas grasas o que su contenido de estas sea [place]bajo.  Verifique la cantidad de miligramos (mg) de sal (sodio) en una porcin. La Harley-Davidsonmayora de las personas deben limitar la ingesta de sodio total a menos de 2300mg  por Futures traderda.  Siempre consulte la informacin nutricional  de los alimentos etiquetados como "con bajo contenido de grasa" o "sin grasa". Estos alimentos pueden tener un mayor contenido de International aid/development workerazcar agregada o carbohidratos refinados, y deben evitarse.  Hable con su nutricionista para identificar sus objetivos diarios en cuanto a los nutrientes mencionados en la etiqueta. Al ir de compras  Evite comprar alimentos procesados, enlatados o precocidos. Estos alimentos tienden a Warehouse managertener  una mayor cantidad de [place], sodio y Engineer, mining.  Compre en la zona exterior de la tienda de comestibles. Esta es la zona donde se encuentran con mayor frecuencia las frutas y las verduras frescas, los cereales a granel, las carnes frescas y los productos lcteos frescos. Al cocinar  Utilice mtodos de coccin a baja temperatura, como hornear, en lugar de mtodos de coccin a alta temperatura, como frer en abundante aceite.  Cocine con aceites saludables, como el aceite de [place], canola o [place].  Evite cocinar con manteca, crema o carnes con alto contenido de grasa. Planificacin de las comidas  Coma las comidas y los refrigerios regularmente, preferentemente a la misma hora todos [place]. Evite pasar largos perodos de tiempo sin comer.  Consuma alimentos ricos en fibra, como frutas frescas, verduras, frijoles y cereales integrales. Consulte a su nutricionista sobre cuntas porciones de carbohidratos puede consumir en cada comida.  Consuma entre 4 y 6 onzas (entre 112 y 168g) de protenas magras por da, como carnes magras, pollo, pescado, huevos o tofu. Una onza (oz) de protena magra equivale a: ? 1 onza (28g) de carne, pollo o pescado. ? 1huevo. ?  de taza (62 g) de tofu.  Coma algunos alimentos por da que contengan grasas saludables, como aguacates, frutos secos, semillas y pescado.   Qu alimentos debo comer? Nils Pyle Bayas. Manzanas. Naranjas. Duraznos. Damascos. Ciruelas. Uvas. Mango. Papaya. Granada. Kiwi. Cerezas. Hoover Brunette Deatra James. Espinaca.  Verduras de [employer], que incluyen col rizada, [place], hojas de [place] y de [place]. Remolachas. Coliflor. Repollo. Brcoli. Zanahorias. Judas verdes. Tomates. Pimientos. Cebollas. Pepinos. Coles de Bruselas. Granos Granos integrales, como panes, galletas, tortillas, cereales y pastas de salvado o integrales. Avena sin azcar. Quinua. Arroz integral o salvaje. Carnes y Warehouse manager. Carne de ave sin piel. Cortes magros de ave y carne de res. Tofu. Frutos secos. Semillas. Lcteos Productos lcteos sin grasa o con bajo contenido de [place], [place], yogur y [place]. Es posible que los productos que se enumeran ms [place] no constituyan una lista completa de los alimentos y las bebidas que puede tomar. Consulte a un nutricionista para obtener ms informacin. Qu alimentos debo evitar? Nils Pyle Frutas enlatadas al almbar. Verduras Verduras enlatadas. Verduras congeladas con mantequilla o salsa de crema. Granos Productos elaborados con [place] y [place], como panes, pastas, bocadillos y cereales. Evite todos los alimentos procesados. Carnes y [address] de carne con alto contenido de Holiday representative. Carne de ave con piel. Carnes empanizadas o fritas. Carne procesada. Evite las grasas saturadas. Lcteos Yogur, queso o [employer]. Bebidas Bebidas azucaradas, como gaseosas o t helado. Es posible que los productos que se enumeran ms [place] no constituyan una lista completa de los alimentos y las bebidas que Personnel officer. Consulte a un nutricionista para obtener ms informacin. Preguntas para hacerle al mdico  Es necesario que me rena con IT trainer en el cuidado de la diabetes?  Es necesario que me rena con un nutricionista?  A qu nmero puedo llamar si tengo preguntas?  Cules son los mejores momentos para controlar la glucemia? Dnde encontrar ms informacin:  Asociacin Estadounidense de la Diabetes (American Diabetes Association):  diabetes.org  Academy of Nutrition and Dietetics (Academia de Nutricin y Pension scheme manager): www.eatright.[employer] of Diabetes and Digestive and Kidney Diseases [employer] de la Diabetes y las Enfermedades Digestivas y Renales): CarFlippers.tn  Association of Diabetes Care and Education Specialists (Asociacin de Especialistas en Atencin y Educacin sobre la Diabetes): www.diabeteseducator.org Resumen  Es  importante tener hbitos alimenticios saludables debido a que sus niveles de Banker (glucosa) se ven afectados en gran medida por lo que come y bebe.  Un plan de alimentacin saludable lo ayudar a controlar la glucemia y Pharmacologist un estilo de vida saludable.  El mdico puede recomendarle que trabaje con un nutricionista para elaborar el mejor plan para usted.  Tenga en cuenta que los carbohidratos (hidratos de carbono) y el alcohol tienen efectos inmediatos en sus niveles de glucemia. Es importante contar los carbohidratos que ingiere y consumir alcohol con prudencia. Esta informacin no tiene Theme park manager el consejo del mdico. Asegrese de hacerle al mdico cualquier pregunta que tenga. Document Revised: 09/21/2019 Document Reviewed: 09/21/2019 Elsevier Patient Education  2021 Elsevier Inc.      Edwina Barth, MD [facility] at [facility]

## 2021-02-03 LAB — COMPREHENSIVE METABOLIC PANEL
ALT: 19 U/L (ref 0–53)
AST: 39 U/L — ABNORMAL HIGH (ref 0–37)
Albumin: 4.5 g/dL (ref 3.5–5.2)
Alkaline Phosphatase: 60 U/L (ref 39–117)
BUN: 20 mg/dL (ref 6–23)
CO2: 26 mEq/L (ref 19–32)
Calcium: 9.1 mg/dL (ref 8.4–10.5)
Chloride: 100 mEq/L (ref 96–112)
Creatinine, Ser: 0.73 mg/dL (ref 0.40–1.50)
GFR: 96.71 mL/min (ref >60.00)
Glucose, Bld: 92 mg/dL (ref 70–99)
Potassium: 4.3 mEq/L (ref 3.5–5.1)
Sodium: 133 mEq/L — ABNORMAL LOW (ref 135–145)
Total Bilirubin: 0.4 mg/dL (ref 0.2–1.2)
Total Protein: 7.2 g/dL (ref 6.0–8.3)

## 2021-02-03 LAB — LIPID PANEL
Cholesterol: 171 mg/dL (ref 0–200)
HDL: 64.9 mg/dL (ref >39.00)
LDL Cholesterol: 83 mg/dL (ref 0–99)
NonHDL: 105.81
Total CHOL/HDL Ratio: 3
Triglycerides: 112 mg/dL (ref 0.0–149.0)
VLDL: 22.4 mg/dL (ref 0.0–40.0)

## 2021-02-12 ENCOUNTER — Ambulatory Visit
Admission: RE | Admit: 2021-02-12 | Discharge: 2021-02-12 | Disposition: A | Discharge status: Home / Self Care | Payer: 59 | Source: Ambulatory Visit | Attending: Emergency Medicine | Admitting: Emergency Medicine

## 2021-02-12 DIAGNOSIS — M7989 Other specified soft tissue disorders: Secondary | ICD-10-CM

## 2021-02-13 ENCOUNTER — Telehealth: Payer: Self-pay | Admitting: Emergency Medicine

## 2021-02-13 DIAGNOSIS — R590 Localized enlarged lymph nodes: Secondary | ICD-10-CM

## 2021-02-13 DIAGNOSIS — R070 Pain in throat: Secondary | ICD-10-CM

## 2021-02-13 NOTE — Telephone Encounter (Signed)
Soft tissue ultrasound of neck report discussed with patient. ENT referral placed today.

## 2021-03-24 ENCOUNTER — Other Ambulatory Visit: Payer: Self-pay

## 2021-03-24 ENCOUNTER — Ambulatory Visit (INDEPENDENT_AMBULATORY_CARE_PROVIDER_SITE_OTHER): Payer: No Typology Code available for payment source | Admitting: Otolaryngology

## 2021-03-24 DIAGNOSIS — R591 Generalized enlarged lymph nodes: Secondary | ICD-10-CM

## 2021-03-24 NOTE — Progress Notes (Signed)
HPI: Erik Mcgee is a 65 y.o. male who presents is referred by Dr. Alvy Bimler for evaluation of right neck nodule that patient has felt for over a year on the right side of his neck adjacent to the larynx.  He apparently had an ultrasound performed recently that demonstrated a 0.6 cm abnormal appearing lymph node.  He denies any sore throat has not been hoarse. He does not smoke.  He feels the nodule in the neck more so when he pushes on the left side of his neck..  Past Medical History:  Diagnosis Date   Hypertension    No past surgical history on file. Social History   Socioeconomic History   Marital status: Married    Spouse name: Not on file   Number of children: Not on file   Years of education: Not on file   Highest education level: Not on file  Occupational History   Not on file  Tobacco Use   Smoking status: Never   Smokeless tobacco: Never  Substance and Sexual Activity   Alcohol use: No   Drug use: No   Sexual activity: Not on file  Other Topics Concern   Not on file  Social History Narrative   Not on file   Social Determinants of Health   Financial Resource Strain: Not on file  Food Insecurity: Not on file  Transportation Needs: Not on file  Physical Activity: Not on file  Stress: Not on file  Social Connections: Not on file   Family History  Problem Relation Age of Onset   Cancer Mother        melamona   Heart disease Father    No Known Allergies Prior to Admission medications   Medication Sig Start Date End Date Taking? Authorizing Provider  amLODipine (NORVASC) 5 MG tablet Take 1 tablet by mouth once daily 04/19/20   Georgina Quint, MD  rosuvastatin (CRESTOR) 5 MG tablet Take 1 tablet (5 mg total) by mouth daily. Patient not taking: Reported on 01/30/2021 09/20/19   Georgina Quint, MD  tadalafil (ADCIRCA/CIALIS) 20 MG tablet Take 0.5-1 tablets (10-20 mg total) by mouth every other day as needed for erectile dysfunction. Patient not taking:  Reported on 01/30/2021 07/05/18   Georgina Quint, MD     Positive ROS: Otherwise negative   All other systems have been reviewed and were otherwise negative with the exception of those mentioned in the HPI and as above.  Physical Exam: Constitutional: Alert, well-appearing, no acute distress Ears: External ears without lesions or tenderness. Ear canals are clear bilaterally with intact, clear TMs.  Nasal: External nose without lesions. Septum with minimal deformity and mild rhinitis.. Clear nasal passages otherwise. Oral: Lips and gums without lesions. Tongue and palate mucosa without lesions. Posterior oropharynx clear.  Tonsil regions appear benign bilaterally.  Indirect laryngoscopy revealed a clear base of tongue vallecula and epiglottis.  Vocal cords are clear bilaterally with normal vocal mobility.  AE folds are normal and piriform sinuses are clear. Neck: The nodule that the patient points to that he feels represents the lateral extent of the laryngeal cartilage.  There may be an adjacent small lymph node to this but this is minimal and mobile.  The larger nodule that he feels represents the superior lateral cartilage of the larynx.  He has no significant palpable adenopathy along the chain jugular chain of lymph nodes and no supraclavicular adenopathy. Respiratory: Breathing comfortably  Skin: No facial/neck lesions or rash noted.  Procedures  Assessment: The nodule that the patient demonstrates to me represents the superior tubercle of the laryngeal cartilage.  On clinical exam I do not appreciate any significant adenopathy. Patient has a clear hypopharynx and larynx on internal examination.  Plan: Reassured patient of normal examination and would not recommend any further therapy unless this enlarges. He will follow-up as needed   Narda Bonds, MD   CC:

## 2021-03-25 ENCOUNTER — Ambulatory Visit (INDEPENDENT_AMBULATORY_CARE_PROVIDER_SITE_OTHER): Payer: 59 | Admitting: Otolaryngology

## 2021-05-21 ENCOUNTER — Other Ambulatory Visit: Payer: Self-pay | Admitting: Emergency Medicine

## 2021-05-21 DIAGNOSIS — I10 Essential (primary) hypertension: Secondary | ICD-10-CM

## 2021-12-02 ENCOUNTER — Encounter: Payer: Self-pay | Admitting: Emergency Medicine

## 2021-12-02 ENCOUNTER — Ambulatory Visit (INDEPENDENT_AMBULATORY_CARE_PROVIDER_SITE_OTHER): Payer: No Typology Code available for payment source | Admitting: Emergency Medicine

## 2021-12-02 VITALS — BP 138/74 | HR 85 | Temp 98.1°F | Ht 69.0 in | Wt 180.5 lb

## 2021-12-02 DIAGNOSIS — I10 Essential (primary) hypertension: Secondary | ICD-10-CM

## 2021-12-02 DIAGNOSIS — I152 Hypertension secondary to endocrine disorders: Secondary | ICD-10-CM | POA: Diagnosis not present

## 2021-12-02 DIAGNOSIS — Z125 Encounter for screening for malignant neoplasm of prostate: Secondary | ICD-10-CM

## 2021-12-02 DIAGNOSIS — E1159 Type 2 diabetes mellitus with other circulatory complications: Secondary | ICD-10-CM | POA: Diagnosis not present

## 2021-12-02 DIAGNOSIS — Z0000 Encounter for general adult medical examination without abnormal findings: Secondary | ICD-10-CM | POA: Diagnosis not present

## 2021-12-02 DIAGNOSIS — Z130 Encounter for screening for diseases of the blood and blood-forming organs and certain disorders involving the immune mechanism: Secondary | ICD-10-CM

## 2021-12-02 LAB — CBC WITH DIFFERENTIAL/PLATELET
Basophils Absolute: 0 10*3/uL (ref 0.0–0.1)
Basophils Relative: 0.9 % (ref 0.0–3.0)
Eosinophils Absolute: 0.3 10*3/uL (ref 0.0–0.7)
Eosinophils Relative: 5 % (ref 0.0–5.0)
HCT: 38.1 % — ABNORMAL LOW (ref 39.0–52.0)
Hemoglobin: 12.5 g/dL — ABNORMAL LOW (ref 13.0–17.0)
Lymphocytes Relative: 32.1 % (ref 12.0–46.0)
Lymphs Abs: 1.7 10*3/uL (ref 0.7–4.0)
MCHC: 32.9 g/dL (ref 30.0–36.0)
MCV: 84.8 fl (ref 78.0–100.0)
Monocytes Absolute: 0.5 10*3/uL (ref 0.1–1.0)
Monocytes Relative: 8.9 % (ref 3.0–12.0)
Neutro Abs: 2.8 10*3/uL (ref 1.4–7.7)
Neutrophils Relative %: 53.1 % (ref 43.0–77.0)
Platelets: 212 10*3/uL (ref 150.0–400.0)
RBC: 4.5 Mil/uL (ref 4.22–5.81)
RDW: 14.6 % (ref 11.5–15.5)
WBC: 5.3 10*3/uL (ref 4.0–10.5)

## 2021-12-02 LAB — PSA: PSA: 1.25 ng/mL (ref 0.10–4.00)

## 2021-12-02 LAB — COMPREHENSIVE METABOLIC PANEL
ALT: 18 U/L (ref 0–53)
AST: 18 U/L (ref 0–37)
Albumin: 4.6 g/dL (ref 3.5–5.2)
Alkaline Phosphatase: 62 U/L (ref 39–117)
BUN: 17 mg/dL (ref 6–23)
CO2: 30 mEq/L (ref 19–32)
Calcium: 9.7 mg/dL (ref 8.4–10.5)
Chloride: 104 mEq/L (ref 96–112)
Creatinine, Ser: 0.91 mg/dL (ref 0.40–1.50)
GFR: 89.07 mL/min (ref >60.00)
Glucose, Bld: 114 mg/dL — ABNORMAL HIGH (ref 70–99)
Potassium: 3.9 mEq/L (ref 3.5–5.1)
Sodium: 141 mEq/L (ref 135–145)
Total Bilirubin: 0.5 mg/dL (ref 0.2–1.2)
Total Protein: 7.1 g/dL (ref 6.0–8.3)

## 2021-12-02 LAB — MICROALBUMIN / CREATININE URINE RATIO
Creatinine,U: 109.5 mg/dL
Microalb Creat Ratio: 0.8 mg/g (ref 0.0–30.0)
Microalb, Ur: 0.9 mg/dL (ref 0.0–1.9)

## 2021-12-02 LAB — URINALYSIS
Bilirubin Urine: NEGATIVE
Hgb urine dipstick: NEGATIVE
Leukocytes,Ua: NEGATIVE
Nitrite: NEGATIVE
Specific Gravity, Urine: 1.015 (ref 1.000–1.030)
Total Protein, Urine: NEGATIVE
Urine Glucose: NEGATIVE
Urobilinogen, UA: 1 (ref 0.0–1.0)
pH: 8 (ref 5.0–8.0)

## 2021-12-02 LAB — LIPID PANEL
Cholesterol: 163 mg/dL (ref 0–200)
HDL: 57 mg/dL (ref >39.00)
LDL Cholesterol: 75 mg/dL (ref 0–99)
NonHDL: 105.71
Total CHOL/HDL Ratio: 3
Triglycerides: 155 mg/dL — ABNORMAL HIGH (ref 0.0–149.0)
VLDL: 31 mg/dL (ref 0.0–40.0)

## 2021-12-02 LAB — HEMOGLOBIN A1C: Hgb A1c MFr Bld: 6.6 % — ABNORMAL HIGH (ref 4.6–6.5)

## 2021-12-02 MED ORDER — AMLODIPINE BESYLATE 5 MG PO TABS: 5.0000 mg | ORAL_TABLET | Freq: Every day | ORAL | 3 refills | Status: DC

## 2021-12-02 NOTE — Patient Instructions (Signed)
Mantenimiento de la salud en los hombres °Health Maintenance, Male °Adoptar un estilo de vida saludable y recibir atención preventiva son importantes para promover la salud y el bienestar. Consulte al médico sobre: °El esquema adecuado para hacerse pruebas y exámenes periódicos. °Cosas que puede hacer por su cuenta para prevenir enfermedades y mantenerse sano. °¿Qué debo saber sobre la dieta, el peso y el ejercicio? °Consuma una dieta saludable ° °Consuma una dieta que incluya muchas verduras, frutas, productos lácteos con bajo contenido de grasa y proteínas magras. °No consuma muchos alimentos ricos en grasas sólidas, azúcares agregados o sodio. °Mantenga un peso saludable °El índice de masa muscular (IMC) es una medida que puede utilizarse para identificar posibles problemas de peso. Proporciona una estimación de la grasa corporal basándose en el peso y la altura. Su médico puede ayudarle a determinar su IMC y a lograr o mantener un peso saludable. °Haga ejercicio con regularidad °Haga ejercicio con regularidad. Esta es una de las prácticas más importantes que puede hacer por su salud. La mayoría de los adultos deben seguir estas pautas: °Realizar, al menos, 150 minutos de actividad física por semana. El ejercicio debe aumentar la frecuencia cardíaca y hacerlo transpirar (ejercicio de intensidad moderada). °Hacer ejercicios de fortalecimiento por lo menos dos veces por semana. Agregue esto a su plan de ejercicio de intensidad moderada. °Pase menos tiempo sentado. Incluso la actividad física ligera puede ser beneficiosa. °Controle sus niveles de colesterol y lípidos en la sangre °Comience a realizarse análisis de lípidos y colesterol en la sangre a los 20 años y luego repítalos cada 5 años. °Es posible que necesite controlar los niveles de colesterol con mayor frecuencia si: °Sus niveles de lípidos y colesterol son altos. °Es mayor de 40 años. °Presenta un alto riesgo de padecer enfermedades cardíacas. °¿Qué debo  saber sobre las pruebas de detección del cáncer? °Muchos tipos de cáncer pueden detectarse de manera temprana y, a menudo, pueden prevenirse. Según su historia clínica y sus antecedentes familiares, es posible que deba realizarse pruebas de detección del cáncer en diferentes edades. Esto puede incluir pruebas de detección de lo siguiente: °Cáncer colorrectal. °Cáncer de próstata. °Cáncer de piel. °Cáncer de pulmón. °¿Qué debo saber sobre la enfermedad cardíaca, la diabetes y la hipertensión arterial? °Presión arterial y enfermedad cardíaca °La hipertensión arterial causa enfermedades cardíacas y aumenta el riesgo de accidente cerebrovascular. Es más probable que esto se manifieste en las personas que tienen lecturas de presión arterial alta o tienen sobrepeso. °Hable con el médico sobre sus valores de presión arterial deseados. °Hágase controlar la presión arterial: °Cada 3 a 5 años si tiene entre 18 y 39 años. °Todos los años si es mayor de 40 años. °Si tiene entre 65 y 75 años y es fumador o solía fumar, pregúntele al médico si debe realizarse una prueba de detección de aneurisma aórtico abdominal (AAA) por única vez. °Diabetes °Realícese exámenes de detección de la diabetes con regularidad. Este análisis revisa el nivel de azúcar en la sangre en ayunas. Hágase las pruebas de detección: °Cada tres años después de los 45 años de edad si tiene un peso normal y un bajo riesgo de padecer diabetes. °Con más frecuencia y a partir de una edad inferior si tiene sobrepeso o un alto riesgo de padecer diabetes. °¿Qué debo saber sobre la prevención de infecciones? °Hepatitis B °Si tiene un riesgo más alto de contraer hepatitis B, debe someterse a un examen de detección de este virus. Hable con el médico para averiguar si tiene riesgo de   contraer la infección por hepatitis B. °Hepatitis C °Se recomienda un análisis de sangre para: °Todos los que nacieron entre 1945 y 1965. °Todas las personas que tengan un riesgo de haber  contraído hepatitis C. °Enfermedades de transmisión sexual (ETS) °Debe realizarse pruebas de detección de ITS todos los años, incluidas la gonorrea y la clamidia, si: °Es sexualmente activo y es menor de 24 años. °Es mayor de 24 años, y el médico le informa que corre riesgo de tener este tipo de infecciones. °La actividad sexual ha cambiado desde que le hicieron la última prueba de detección y tiene un riesgo mayor de tener clamidia o gonorrea. Pregúntele al médico si usted tiene riesgo. °Pregúntele al médico si usted tiene un alto riesgo de contraer VIH. El médico también puede recomendarle un medicamento recetado para ayudar a evitar la infección por el VIH. Si elige tomar medicamentos para prevenir el VIH, primero debe hacerse los análisis de detección del VIH. Luego debe hacerse análisis cada 3 meses mientras esté tomando los medicamentos. °Siga estas indicaciones en su casa: °Consumo de alcohol °No beba alcohol si el médico se lo prohíbe. °Si bebe alcohol: °Limite la cantidad que consume de 0 a 2 bebidas por día. °Sepa cuánta cantidad de alcohol hay en las bebidas que toma. [place], una medida equivale a una botella de cerveza de 12 oz (355 ml), un vaso de vino de 5 oz (148 ml) o un vaso de una bebida alcohólica de alta graduación de 1½ oz (44 ml). °Estilo de vida °No consuma ningún producto que contenga nicotina o tabaco. Estos productos incluyen cigarrillos, tabaco para mascar y aparatos de vapeo, como los cigarrillos electrónicos. Si necesita ayuda para dejar de consumir estos productos, consulte al médico. °No consuma drogas. °No comparta agujas. °Solicite ayuda a su médico si necesita apoyo o información para abandonar las drogas. °Indicaciones generales °Realícese los estudios de rutina de la salud, dentales y de la vista. °Manténgase al día con las vacunas. °Infórmele a su médico si: °Se siente deprimido con frecuencia. °Alguna vez ha sido víctima de maltrato o no se siente seguro en su  casa. °Resumen °Adoptar un estilo de vida saludable y recibir atención preventiva son importantes para promover la salud y el bienestar. °Siga las instrucciones del médico acerca de una dieta saludable, el ejercicio y la realización de pruebas o exámenes para detectar enfermedades. °Siga las instrucciones del médico con respecto al control del colesterol y la presión arterial. °Esta información no tiene como fin reemplazar el consejo del médico. Asegúrese de hacerle al médico cualquier pregunta que tenga. °Document Revised: 01/22/2021 Document Reviewed: 01/22/2021 °Elsevier Patient Education © 2022 Elsevier Inc. ° °

## 2021-12-02 NOTE — Progress Notes (Signed)
Erik Mcgee ?66 y.o. ? ? ?Chief Complaint  ?Patient presents with  ? Annual Exam  ? urinary issues  ?  Pt states when he urinates he sees a lot of "foam" especially in the am   ? ? ?HISTORY OF PRESENT ILLNESS: ?This is a 66 y.o. male here for annual exam. ?Has history of hypertension and diabetes. ?Complaining of foamy urine mostly in the morning. ?Colonoscopy done in 2018.  Told to come back in 7 to 10 years. ?Lab Results  ?Component Value Date  ? HGBA1C 6.2 (A) 01/30/2021  ? ?BP Readings from Last 3 Encounters:  ?12/02/21 138/74  ?01/30/21 130/84  ?09/30/20 132/75  ? ? ? ?HPI ? ? ?Prior to Admission medications   ?Medication Sig Start Date End Date Taking? Authorizing Provider  ?amLODipine (NORVASC) 5 MG tablet Take 1 tablet (5 mg total) by mouth daily. 05/22/21 08/20/21  Georgina Quint, MD  ?rosuvastatin (CRESTOR) 5 MG tablet Take 1 tablet (5 mg total) by mouth daily. ?Patient not taking: Reported on 01/30/2021 09/20/19   Georgina Quint, MD  ?tadalafil (ADCIRCA/CIALIS) 20 MG tablet Take 0.5-1 tablets (10-20 mg total) by mouth every other day as needed for erectile dysfunction. ?Patient not taking: Reported on 01/30/2021 07/05/18   Georgina Quint, MD  ? ? ?No Known Allergies ? ?Patient Active Problem List  ? Diagnosis Date Noted  ? Mass of soft tissue of neck 01/30/2021  ? Statin intolerance 03/19/2020  ? Snoring 07/05/2018  ? Erectile dysfunction 07/05/2018  ? History of gastroesophageal reflux (GERD) 09/10/2017  ? History of hyperglycemia 07/28/2017  ? Hypertension associated with diabetes (HCC) 07/28/2017  ? ? ?Past Medical History:  ?Diagnosis Date  ? Hypertension   ? ? ?No past surgical history on file. ? ?Social History  ? ?Socioeconomic History  ? Marital status: Married  ?  Spouse name: Not on file  ? Number of children: Not on file  ? Years of education: Not on file  ? Highest education level: Not on file  ?Occupational History  ? Not on file  ?Tobacco Use  ? Smoking status: Never  ?  Smokeless tobacco: Never  ?Substance and Sexual Activity  ? Alcohol use: No  ? Drug use: No  ? Sexual activity: Not on file  ?Other Topics Concern  ? Not on file  ?Social History Narrative  ? Not on file  ? ?Social Determinants of Health  ? ?Financial Resource Strain: Not on file  ?Food Insecurity: Not on file  ?Transportation Needs: Not on file  ?Physical Activity: Not on file  ?Stress: Not on file  ?Social Connections: Not on file  ?Intimate Partner Violence: Not on file  ? ? ?Family History  ?Problem Relation Age of Onset  ? Cancer Mother   ?     melamona  ? Heart disease Father   ? ? ? ?Review of Systems  ?Constitutional: Negative.  Negative for chills and fever.  ?HENT: Negative.  Negative for congestion and sore throat.   ?Eyes: Negative.   ?Respiratory: Negative.  Negative for cough and shortness of breath.   ?Cardiovascular: Negative.  Negative for chest pain and palpitations.  ?Gastrointestinal: Negative.  Negative for nausea and vomiting.  ?Genitourinary: Negative.  Negative for dysuria, flank pain, frequency, hematuria and urgency.  ?     Foamy urine  ?Skin: Negative.  Negative for rash.  ?Neurological: Negative.  Negative for dizziness and headaches.  ?All other systems reviewed and are negative. ?Today's Vitals  ? 12/02/21  1426  ?BP: 138/74  ?Pulse: 85  ?Temp: 98.1 ?F (36.7 ?C)  ?TempSrc: Oral  ?SpO2: 95%  ?Weight: 180 lb 8 oz (81.9 kg)  ?Height: 5\' 9"  (1.753 m)  ? ?Body mass index is 26.66 kg/m?. ? ? ?Physical Exam ?Vitals reviewed.  ?Constitutional:   ?   Appearance: Normal appearance.  ?HENT:  ?   Head: Normocephalic.  ?   Right Ear: Tympanic membrane, ear canal and external ear normal.  ?   Left Ear: Tympanic membrane, ear canal and external ear normal.  ?   Mouth/Throat:  ?   Mouth: Mucous membranes are moist.  ?   Pharynx: Oropharynx is clear.  ?Eyes:  ?   Extraocular Movements: Extraocular movements intact.  ?   Conjunctiva/sclera: Conjunctivae normal.  ?   Pupils: Pupils are equal, round, and  reactive to light.  ?Cardiovascular:  ?   Rate and Rhythm: Normal rate and regular rhythm.  ?   Pulses: Normal pulses.  ?   Heart sounds: Normal heart sounds.  ?Pulmonary:  ?   Effort: Pulmonary effort is normal.  ?   Breath sounds: Normal breath sounds.  ?Abdominal:  ?   General: There is no distension.  ?   Palpations: Abdomen is soft.  ?   Tenderness: There is no abdominal tenderness.  ?Musculoskeletal:     ?   General: Normal range of motion.  ?   Cervical back: Neck supple. No tenderness.  ?Lymphadenopathy:  ?   Cervical: No cervical adenopathy.  ?Skin: ?   General: Skin is warm and dry.  ?   Capillary Refill: Capillary refill takes less than 2 seconds.  ?Neurological:  ?   General: No focal deficit present.  ?   Mental Status: He is alert and oriented to person, place, and time.  ?Psychiatric:     ?   Mood and Affect: Mood normal.     ?   Behavior: Behavior normal.  ? ? ? ?ASSESSMENT & PLAN: ?Problem List Items Addressed This Visit   ? ?  ? Cardiovascular and Mediastinum  ? Hypertension associated with diabetes ([place])  ? Relevant Medications  ? amLODipine (NORVASC) 5 MG tablet  ? Other Relevant Orders  ? Urinalysis  ? Urine Microalbumin w/creat. ratio  ? CBC with Differential/Platelet  ? Comprehensive metabolic panel  ? Hemoglobin A1c  ? Lipid panel  ? ?Other Visit Diagnoses   ? ? Routine general medical examination at a health care facility    -  Primary  ? Essential hypertension      ? Relevant Medications  ? amLODipine (NORVASC) 5 MG tablet  ? Prostate cancer screening      ? Relevant Orders  ? PSA  ? Screening for deficiency anemia      ? ?  ? ?Modifiable risk factors discussed with patient. ?Anticipatory guidance according to age provided. ?The following topics were also discussed: ?Social Determinants of Health ?Smoking.  Non-smoker ?Diet and nutrition ?Benefits of exercise ?Cancer screening  ?Vaccinations recommendations ?Cardiovascular risk assessment ?The 10-year ASCVD risk score (Arnett DK, et al.,  2019) is: 21.7% ?  Values used to calculate the score: ?    Age: 37 years ?    Sex: Male ?    Is Non-Hispanic African American: No ?    Diabetic: Yes ?    Tobacco smoker: No ?    Systolic Blood Pressure: 0000000 mmHg ?    Is BP treated: Yes ?    HDL Cholesterol: 64.9  mg/dL ?    Total Cholesterol: 171 mg/dL ?Hypertension diabetes management ?Medication review ?Mental health including depression and anxiety ?Fall and accident prevention ? ? ?Patient Instructions  ?Mantenimiento de [employer] hombres ?Health Maintenance, Male ?Adoptar un estilo de vida saludable y recibir atenci?n preventiva son importantes para promover la salud y Musician. Consulte al m?dico sobre: ?El esquema adecuado para [place] pruebas y ex?menes peri?dicos. ?Cosas que puede hacer por su cuenta para prevenir enfermedades y [place] sano. ??Qu? debo saber sobre la dieta, el peso y el ejercicio? ?Consuma una dieta saludable ? ?Consuma una dieta que incluya muchas verduras, frutas, productos l?cteos con bajo contenido de grasa y prote?nas magras. ?No consuma muchos alimentos ricos en grasas s?lidas, az?cares agregados o sodio. ?Mantenga un peso saludable ?El ?ndice de masa muscular [employer]) es una medida que puede utilizarse para identificar posibles problemas de [place]. Proporciona una estimaci?n de la grasa corporal bas?ndose en el peso y la altura. Su m?dico puede ayudarle a Radiation protection practitioner [place] y a Scientist, forensic o Theatre manager un peso saludable. ?Haga ejercicio con regularidad ?Haga ejercicio con regularidad. Esta es una de las pr?cticas m?s importantes que puede hacer por su salud. La mayor?a de los adultos deben seguir estas pautas: ?Realizar, al menos, 150 minutos de [place] f?sica por semana. El ejercicio debe aumentar la frecuencia card?aca y Nature conservation officer transpirar (ejercicio de intensidad moderada). ?Hacer ejercicios de fortalecimiento por lo [employer] por semana. Agregue esto a su plan de ejercicio de intensidad moderada. ?Pase menos tiempo  sentado. Incluso la actividad f?sica ligera puede ser beneficiosa. ?Controle sus niveles de colesterol y l?pidos en la sangre ?Comience a realizarse an?lisis de l?pidos y Research officer, trade union en la sangre a los 20 a?os y lueg

## 2021-12-23 ENCOUNTER — Telehealth: Payer: Self-pay | Admitting: Emergency Medicine

## 2021-12-23 NOTE — Telephone Encounter (Signed)
Called patient to inform him of his lab results. Patient verbalize understanding. No further questions. Mailed a copy of his labs to address on file  ?

## 2021-12-23 NOTE — Telephone Encounter (Signed)
Pt checking status of 12-02-2021 lab results ? ?Pt states he does not have access to mychart and requesting a cb w/ results ?

## 2022-09-09 DIAGNOSIS — R350 Frequency of micturition: Secondary | ICD-10-CM | POA: Diagnosis not present

## 2022-09-09 DIAGNOSIS — R3916 Straining to void: Secondary | ICD-10-CM | POA: Diagnosis not present

## 2022-09-09 DIAGNOSIS — R399 Unspecified symptoms and signs involving the genitourinary system: Secondary | ICD-10-CM | POA: Diagnosis not present

## 2022-09-09 DIAGNOSIS — R351 Nocturia: Secondary | ICD-10-CM | POA: Diagnosis not present

## 2022-09-09 DIAGNOSIS — R3915 Urgency of urination: Secondary | ICD-10-CM | POA: Diagnosis not present

## 2022-12-20 ENCOUNTER — Other Ambulatory Visit: Payer: Self-pay | Admitting: Emergency Medicine

## 2022-12-20 DIAGNOSIS — E1159 Type 2 diabetes mellitus with other circulatory complications: Secondary | ICD-10-CM

## 2022-12-20 DIAGNOSIS — I10 Essential (primary) hypertension: Secondary | ICD-10-CM

## 2023-02-24 ENCOUNTER — Encounter: Payer: Self-pay | Admitting: Emergency Medicine

## 2023-03-08 ENCOUNTER — Encounter: Payer: Self-pay | Admitting: Emergency Medicine

## 2023-03-08 ENCOUNTER — Ambulatory Visit (INDEPENDENT_AMBULATORY_CARE_PROVIDER_SITE_OTHER): Payer: Medicare HMO | Admitting: Emergency Medicine

## 2023-03-08 VITALS — BP 132/80 | HR 60 | Temp 97.8°F | Ht 69.0 in | Wt 177.4 lb

## 2023-03-08 DIAGNOSIS — I152 Hypertension secondary to endocrine disorders: Secondary | ICD-10-CM

## 2023-03-08 DIAGNOSIS — Z8719 Personal history of other diseases of the digestive system: Secondary | ICD-10-CM

## 2023-03-08 DIAGNOSIS — Z130 Encounter for screening for diseases of the blood and blood-forming organs and certain disorders involving the immune mechanism: Secondary | ICD-10-CM | POA: Diagnosis not present

## 2023-03-08 DIAGNOSIS — Z13220 Encounter for screening for lipoid disorders: Secondary | ICD-10-CM

## 2023-03-08 DIAGNOSIS — Z789 Other specified health status: Secondary | ICD-10-CM

## 2023-03-08 DIAGNOSIS — Z1329 Encounter for screening for other suspected endocrine disorder: Secondary | ICD-10-CM | POA: Diagnosis not present

## 2023-03-08 DIAGNOSIS — Z13228 Encounter for screening for other metabolic disorders: Secondary | ICD-10-CM

## 2023-03-08 DIAGNOSIS — R82998 Other abnormal findings in urine: Secondary | ICD-10-CM | POA: Diagnosis not present

## 2023-03-08 DIAGNOSIS — Z0001 Encounter for general adult medical examination with abnormal findings: Secondary | ICD-10-CM

## 2023-03-08 DIAGNOSIS — E1159 Type 2 diabetes mellitus with other circulatory complications: Secondary | ICD-10-CM

## 2023-03-08 LAB — CBC WITH DIFFERENTIAL/PLATELET
Basophils Absolute: 0 10*3/uL (ref 0.0–0.1)
Basophils Relative: 0.5 % (ref 0.0–3.0)
Eosinophils Absolute: 0.1 10*3/uL (ref 0.0–0.7)
Eosinophils Relative: 1.4 % (ref 0.0–5.0)
HCT: 43 % (ref 39.0–52.0)
Hemoglobin: 13.8 g/dL (ref 13.0–17.0)
Lymphocytes Relative: 42.4 % (ref 12.0–46.0)
Lymphs Abs: 1.8 10*3/uL (ref 0.7–4.0)
MCHC: 32 g/dL (ref 30.0–36.0)
MCV: 87.7 fl (ref 78.0–100.0)
Monocytes Absolute: 0.6 10*3/uL (ref 0.1–1.0)
Monocytes Relative: 13 % — ABNORMAL HIGH (ref 3.0–12.0)
Neutro Abs: 1.8 10*3/uL (ref 1.4–7.7)
Neutrophils Relative %: 42.7 % — ABNORMAL LOW (ref 43.0–77.0)
Platelets: 180 10*3/uL (ref 150.0–400.0)
RBC: 4.9 Mil/uL (ref 4.22–5.81)
RDW: 14.1 % (ref 11.5–15.5)
WBC: 4.3 10*3/uL (ref 4.0–10.5)

## 2023-03-08 LAB — COMPREHENSIVE METABOLIC PANEL
ALT: 28 U/L (ref 0–53)
AST: 25 U/L (ref 0–37)
Albumin: 4.4 g/dL (ref 3.5–5.2)
Alkaline Phosphatase: 58 U/L (ref 39–117)
BUN: 14 mg/dL (ref 6–23)
CO2: 33 mEq/L — ABNORMAL HIGH (ref 19–32)
Calcium: 9.6 mg/dL (ref 8.4–10.5)
Chloride: 101 mEq/L (ref 96–112)
Creatinine, Ser: 0.68 mg/dL (ref 0.40–1.50)
GFR: 97.37 mL/min (ref >60.00)
Glucose, Bld: 111 mg/dL — ABNORMAL HIGH (ref 70–99)
Potassium: 4.5 mEq/L (ref 3.5–5.1)
Sodium: 139 mEq/L (ref 135–145)
Total Bilirubin: 0.6 mg/dL (ref 0.2–1.2)
Total Protein: 7.2 g/dL (ref 6.0–8.3)

## 2023-03-08 LAB — URINALYSIS
Bilirubin Urine: NEGATIVE
Hgb urine dipstick: NEGATIVE
Ketones, ur: NEGATIVE
Leukocytes,Ua: NEGATIVE
Nitrite: NEGATIVE
Specific Gravity, Urine: 1.02 (ref 1.000–1.030)
Total Protein, Urine: NEGATIVE
Urine Glucose: NEGATIVE
Urobilinogen, UA: 0.2 (ref 0.0–1.0)
pH: 6 (ref 5.0–8.0)

## 2023-03-08 LAB — MICROALBUMIN / CREATININE URINE RATIO
Creatinine,U: 129.9 mg/dL
Microalb Creat Ratio: 1.7 mg/g (ref 0.0–30.0)
Microalb, Ur: 2.2 mg/dL — ABNORMAL HIGH (ref 0.0–1.9)

## 2023-03-08 LAB — LIPID PANEL
Cholesterol: 177 mg/dL (ref 0–200)
HDL: 60.1 mg/dL (ref >39.00)
LDL Cholesterol: 105 mg/dL — ABNORMAL HIGH (ref 0–99)
NonHDL: 116.64
Total CHOL/HDL Ratio: 3
Triglycerides: 57 mg/dL (ref 0.0–149.0)
VLDL: 11.4 mg/dL (ref 0.0–40.0)

## 2023-03-08 LAB — HEMOGLOBIN A1C: Hgb A1c MFr Bld: 6.2 % (ref 4.6–6.5)

## 2023-03-08 NOTE — Assessment & Plan Note (Signed)
Well-controlled and asymptomatic. Diet and nutrition discussed

## 2023-03-08 NOTE — Patient Instructions (Signed)
Mantenimiento de la salud en los hombres Health Maintenance, Male Adoptar un estilo de vida saludable y recibir atencin preventiva son importantes para promover la salud y el bienestar. Consulte al mdico sobre: El esquema adecuado para hacerse pruebas y exmenes peridicos. Cosas que puede hacer por su cuenta para prevenir enfermedades y mantenerse sano. Qu debo saber sobre la dieta, el peso y el ejercicio? Consuma una dieta saludable  Consuma una dieta que incluya muchas verduras, frutas, productos lcteos con bajo contenido de grasa y protenas magras. No consuma muchos alimentos ricos en grasas slidas, azcares agregados o sodio. Mantenga un peso saludable El ndice de masa muscular (IMC) es una medida que puede utilizarse para identificar posibles problemas de peso. Proporciona una estimacin de la grasa corporal basndose en el peso y la altura. Su mdico puede ayudarle a determinar su IMC y a lograr o mantener un peso saludable. Haga ejercicio con regularidad Haga ejercicio con regularidad. Esta es una de las prcticas ms importantes que puede hacer por su salud. La mayora de los adultos deben seguir estas pautas: Realizar, al menos, 150 minutos de actividad fsica por semana. El ejercicio debe aumentar la frecuencia cardaca y hacerlo transpirar (ejercicio de intensidad moderada). Hacer ejercicios de fortalecimiento por lo menos dos veces por semana. Agregue esto a su plan de ejercicio de intensidad moderada. Pase menos tiempo sentado. Incluso la actividad fsica ligera puede ser beneficiosa. Controle sus niveles de colesterol y lpidos en la sangre Comience a realizarse anlisis de lpidos y colesterol en la sangre a los 20 aos y luego reptalos cada 5 aos. Es posible que necesite controlar los niveles de colesterol con mayor frecuencia si: Sus niveles de lpidos y colesterol son altos. Es mayor de 40 aos. Presenta un alto riesgo de padecer enfermedades cardacas. Qu debo  saber sobre las pruebas de deteccin del cncer? Muchos tipos de cncer pueden detectarse de manera temprana y, a menudo, pueden prevenirse. Segn su historia clnica y sus antecedentes familiares, es posible que deba realizarse pruebas de deteccin del cncer en diferentes edades. Esto puede incluir pruebas de deteccin de lo siguiente: Cncer colorrectal. Cncer de prstata. Cncer de piel. Cncer de pulmn. Qu debo saber sobre la enfermedad cardaca, la diabetes y la hipertensin arterial? Presin arterial y enfermedad cardaca La hipertensin arterial causa enfermedades cardacas y aumenta el riesgo de accidente cerebrovascular. Es ms probable que esto se manifieste en las personas que tienen lecturas de presin arterial alta o tienen sobrepeso. Hable con el mdico sobre sus valores de presin arterial deseados. Hgase controlar la presin arterial: Cada 3 a 5 aos si tiene entre 18 y 39 aos. Todos los aos si es mayor de 40 aos. Si tiene entre 65 y 75 aos y es fumador o sola fumar, pregntele al mdico si debe realizarse una prueba de deteccin de aneurisma artico abdominal (AAA) por nica vez. Diabetes Realcese exmenes de deteccin de la diabetes con regularidad. Este anlisis revisa el nivel de azcar en la sangre en ayunas. Hgase las pruebas de deteccin: Cada tres aos despus de los 45 aos de edad si tiene un peso normal y un bajo riesgo de padecer diabetes. Con ms frecuencia y a partir de una edad inferior si tiene sobrepeso o un alto riesgo de padecer diabetes. Qu debo saber sobre la prevencin de infecciones? Hepatitis B Si tiene un riesgo ms alto de contraer hepatitis B, debe someterse a un examen de deteccin de este virus. Hable con el mdico para averiguar si tiene riesgo de   contraer la infeccin por hepatitis B. Hepatitis C Se recomienda un anlisis de sangre para: Todos los que nacieron entre 1945 y 1965. Todas las personas que tengan un riesgo de haber  contrado hepatitis C. Enfermedades de transmisin sexual (ETS) Debe realizarse pruebas de deteccin de ITS todos los aos, incluidas la gonorrea y la clamidia, si: Es sexualmente activo y es menor de 24 aos. Es mayor de 24 aos, y el mdico le informa que corre riesgo de tener este tipo de infecciones. La actividad sexual ha cambiado desde que le hicieron la ltima prueba de deteccin y tiene un riesgo mayor de tener clamidia o gonorrea. Pregntele al mdico si usted tiene riesgo. Pregntele al mdico si usted tiene un alto riesgo de contraer VIH. El mdico tambin puede recomendarle un medicamento recetado para ayudar a evitar la infeccin por el VIH. Si elige tomar medicamentos para prevenir el VIH, primero debe hacerse los anlisis de deteccin del VIH. Luego debe hacerse anlisis cada 3 meses mientras est tomando los medicamentos. Siga estas indicaciones en su casa: Consumo de alcohol No beba alcohol si el mdico se lo prohbe. Si bebe alcohol: Limite la cantidad que consume de 0 a 2 bebidas por da. Sepa cunta cantidad de alcohol hay en las bebidas que toma. En los [place], una medida equivale a una botella de cerveza de 12 oz (355 ml), un vaso de vino de 5 oz (148 ml) o un vaso de una bebida alcohlica de alta graduacin de 1 oz (44 ml). Estilo de vida No consuma ningn producto que contenga nicotina o tabaco. Estos productos incluyen cigarrillos, tabaco para mascar y aparatos de vapeo, como los cigarrillos electrnicos. Si necesita ayuda para dejar de consumir estos productos, consulte al mdico. No consuma drogas. No comparta agujas. Solicite ayuda a su mdico si necesita apoyo o informacin para abandonar las drogas. Indicaciones generales Realcese los estudios de rutina de la salud, dentales y de la vista. Mantngase al da con las vacunas. Infrmele a su mdico si: Se siente deprimido con frecuencia. Alguna vez ha sido vctima de maltrato o no se siente seguro en su  casa. Resumen Adoptar un estilo de vida saludable y recibir atencin preventiva son importantes para promover la salud y el bienestar. Siga las instrucciones del mdico acerca de una dieta saludable, el ejercicio y la realizacin de pruebas o exmenes para detectar enfermedades. Siga las instrucciones del mdico con respecto al control del colesterol y la presin arterial. Esta informacin no tiene como fin reemplazar el consejo del mdico. Asegrese de hacerle al mdico cualquier pregunta que tenga. Document Revised: 01/22/2021 Document Reviewed: 01/22/2021 Elsevier Patient Education  2024 Elsevier Inc.  

## 2023-03-08 NOTE — Assessment & Plan Note (Signed)
No signs of infection. Need to reassess kidney function test today Most likely related to amount of protein in his diet Urinalysis sent today Also urine sent for albumin creatinine ratio.

## 2023-03-08 NOTE — Progress Notes (Signed)
Erik Mcgee 67 y.o.   Chief Complaint  Patient presents with   Annual Exam    Patient states when he urinates he states he sees a lot of foam, patient states its all the time,     HISTORY OF PRESENT ILLNESS: This is a 67 y.o. male here for annual exam and follow-up on chronic medical conditions Still complaining of foamy urine for several months Overall doing well. No other complaints or medical concerns today. BP Readings from Last 3 Encounters:  03/08/23 132/80  12/02/21 138/74  01/30/21 130/84   Wt Readings from Last 3 Encounters:  03/08/23 177 lb 6 oz (80.5 kg)  12/02/21 180 lb 8 oz (81.9 kg)  01/30/21 181 lb 12.8 oz (82.5 kg)   Lab Results  Component Value Date   HGBA1C 6.6 (H) 12/02/2021     HPI   Prior to Admission medications   Medication Sig Start Date End Date Taking? Authorizing Provider  amLODipine (NORVASC) 5 MG tablet Take 1 tablet by mouth once daily 12/21/22  Yes Bijon Mineer, Eilleen Kempf, MD  rosuvastatin (CRESTOR) 5 MG tablet Take 1 tablet (5 mg total) by mouth daily. Patient not taking: Reported on 01/30/2021 09/20/19   Georgina Quint, MD  tadalafil (ADCIRCA/CIALIS) 20 MG tablet Take 0.5-1 tablets (10-20 mg total) by mouth every other day as needed for erectile dysfunction. Patient not taking: Reported on 01/30/2021 07/05/18   Georgina Quint, MD    No Known Allergies  Patient Active Problem List   Diagnosis Date Noted   Statin intolerance 03/19/2020   Snoring 07/05/2018   Erectile dysfunction 07/05/2018   History of gastroesophageal reflux (GERD) 09/10/2017   Hypertension associated with diabetes (HCC) 07/28/2017    Past Medical History:  Diagnosis Date   Hypertension     No past surgical history on file.  Social History   Socioeconomic History   Marital status: Married    Spouse name: Not on file   Number of children: Not on file   Years of education: Not on file   Highest education level: Not on file  Occupational History    Not on file  Tobacco Use   Smoking status: Never   Smokeless tobacco: Never  Substance and Sexual Activity   Alcohol use: No   Drug use: No   Sexual activity: Not on file  Other Topics Concern   Not on file  Social History Narrative   Not on file   Social Determinants of Health   Financial Resource Strain: Not on file  Food Insecurity: Not on file  Transportation Needs: Not on file  Physical Activity: Not on file  Stress: Not on file  Social Connections: Not on file  Intimate Partner Violence: Not on file    Family History  Problem Relation Age of Onset   Cancer Mother        melamona   Heart disease Father      Review of Systems  Constitutional: Negative.  Negative for chills and fever.  HENT: Negative.  Negative for congestion and sore throat.   Respiratory: Negative.  Negative for cough and shortness of breath.   Cardiovascular: Negative.  Negative for chest pain and palpitations.  Gastrointestinal:  Negative for abdominal pain, diarrhea, nausea and vomiting.  Genitourinary: Negative.  Negative for dysuria and hematuria.  Skin: Negative.  Negative for rash.  Neurological: Negative.  Negative for dizziness and headaches.  All other systems reviewed and are negative.   Vitals:   03/08/23 9604  BP: 132/80  Pulse: 60  Temp: 97.8 F (36.6 C)  SpO2: 98%    Physical Exam Vitals reviewed.  Constitutional:      Appearance: Normal appearance.  HENT:     Head: Normocephalic.     Mouth/Throat:     Mouth: Mucous membranes are moist.     Pharynx: Oropharynx is clear.  Eyes:     Extraocular Movements: Extraocular movements intact.     Conjunctiva/sclera: Conjunctivae normal.     Pupils: Pupils are equal, round, and reactive to light.  Cardiovascular:     Rate and Rhythm: Normal rate and regular rhythm.     Pulses: Normal pulses.     Heart sounds: Normal heart sounds.  Pulmonary:     Effort: Pulmonary effort is normal.     Breath sounds: Normal breath  sounds.  Abdominal:     Palpations: Abdomen is soft.     Tenderness: There is no abdominal tenderness.  Musculoskeletal:     Cervical back: No tenderness.  Lymphadenopathy:     Cervical: No cervical adenopathy.  Skin:    General: Skin is warm and dry.     Capillary Refill: Capillary refill takes less than 2 seconds.  Neurological:     General: No focal deficit present.     Mental Status: He is alert and oriented to person, place, and time.  Psychiatric:        Mood and Affect: Mood normal.        Behavior: Behavior normal.      ASSESSMENT & PLAN: Problem List Items Addressed This Visit       Cardiovascular and Mediastinum   Hypertension associated with diabetes (HCC)    BP Readings from Last 3 Encounters:  03/08/23 132/80  12/02/21 138/74  01/30/21 130/84  Well-controlled hypertension Continue amlodipine 5 mg daily Cardiovascular risks associated with hypertension and diabetes discussed Diet and nutrition discussed Diet controlled diabetes       Relevant Orders   Urine Microalbumin w/creat. ratio   Comprehensive metabolic panel   CBC with Differential/Platelet   Lipid panel   Hemoglobin A1c     Other   History of gastroesophageal reflux (GERD)    Well-controlled and asymptomatic. Diet and nutrition discussed      Statin intolerance   Foamy urine    No signs of infection. Need to reassess kidney function test today Most likely related to amount of protein in his diet Urinalysis sent today Also urine sent for albumin creatinine ratio.      Relevant Orders   Urine Microalbumin w/creat. ratio   Urinalysis   Other Visit Diagnoses     Encounter for general adult medical examination with abnormal findings    -  Primary   Screening for deficiency anemia       Relevant Orders   CBC with Differential/Platelet   Screening for lipoid disorders       Relevant Orders   Lipid panel   Screening for endocrine, metabolic and immunity disorder       Relevant  Orders   Comprehensive metabolic panel         Modifiable risk factors discussed with patient. Anticipatory guidance according to age provided. The following topics were also discussed: Social Determinants of Health Smoking.  Non-smoker Diet and nutrition.  Good eating habits Benefits of exercise.  Exercises regularly Cancer screening.  States he has had 2 colonoscopies done in the last 10 years.  Local but not with [place].  Last time told  to come back in 7 years.  He had 2 polyps removed.  Noncancerous. Vaccinations reviewed and recommendations Cardiovascular risk assessment The 10-year ASCVD risk score (Arnett DK, et al., 2019) is: 22.8%   Values used to calculate the score:     Age: 17 years     Sex: Male     Is Non-Hispanic African American: No     Diabetic: Yes     Tobacco smoker: No     Systolic Blood Pressure: 132 mmHg     Is BP treated: Yes     HDL Cholesterol: 57 mg/dL     Total Cholesterol: 163 mg/dL Review of chronic medical conditions under management Review of all medications. Mental health including depression and anxiety Fall and accident prevention  Patient Instructions  Mantenimiento de Research officer, political party, Male Adoptar un estilo de vida saludable y recibir atencin preventiva son importantes para promover la salud y Counsellor. Consulte al mdico sobre: El esquema adecuado para hacerse pruebas y exmenes peridicos. Cosas que puede hacer por su cuenta para prevenir enfermedades y [place] sano. Qu debo saber sobre la dieta, el peso y el ejercicio? Consuma una dieta saludable  Consuma una dieta que incluya muchas verduras, frutas, productos lcteos con bajo contenido de [place] y Associate Professor. No consuma muchos alimentos ricos en grasas slidas, azcares agregados o sodio. Mantenga un peso saludable El ndice de masa muscular [facility]) es una medida que puede utilizarse para identificar posibles problemas de [place].  Proporciona una estimacin de la grasa corporal basndose en el peso y la altura. Su mdico puede ayudarle a Engineer, site [facility] y a Personnel officer o Pharmacologist un peso saludable. Haga ejercicio con regularidad Haga ejercicio con regularidad. Esta es una de las prcticas ms importantes que puede hacer por su salud. La [employer] de los adultos deben seguir estas pautas: Education officer, environmental, al menos, 150 minutos de actividad fsica por semana. El ejercicio debe aumentar la frecuencia cardaca y Media planner transpirar (ejercicio de intensidad moderada). Hacer ejercicios de fortalecimiento por lo [employer] por semana. Agregue esto a su plan de ejercicio de intensidad moderada. Pase menos tiempo sentado. Incluso la actividad fsica ligera puede ser beneficiosa. Controle sus niveles de colesterol y lpidos en la sangre Comience a realizarse anlisis de lpidos y Oncologist en la sangre a los 20 aos y luego reptalos cada 5 aos. Es posible que Insurance underwriter los niveles de colesterol con mayor frecuencia si: Sus niveles de lpidos y colesterol son altos. Es mayor de 40 aos. Presenta un alto riesgo de padecer enfermedades cardacas. Qu debo saber sobre las pruebas de deteccin del cncer? Muchos tipos de cncer pueden detectarse de manera temprana y, a menudo, pueden prevenirse. Segn su historia clnica y sus antecedentes familiares, es posible que deba realizarse pruebas de deteccin del cncer en diferentes edades. Esto puede incluir pruebas de deteccin de lo siguiente: Building services engineer. Cncer de prstata. Cncer de piel. Cncer de pulmn. Qu debo saber sobre la enfermedad cardaca, la diabetes y la hipertensin arterial? Presin arterial y enfermedad cardaca La hipertensin arterial causa enfermedades cardacas y [place] el riesgo de accidente cerebrovascular. Es ms probable que esto se manifieste en las personas que tienen lecturas de presin arterial alta o tienen sobrepeso. Hable con el mdico sobre  sus valores de presin arterial deseados. Hgase controlar la presin arterial: Cada 3 a 5 aos si tiene entre 18 y 75 aos. Todos los aos si es mayor de 40 aos. Si tiene entre 65 y  61 aos y es fumador o Insurance underwriter, pregntele al mdico si debe realizarse una prueba de deteccin de aneurisma artico abdominal (AAA) por nica vez. Diabetes Realcese exmenes de deteccin de la diabetes con regularidad. Este anlisis revisa el nivel de azcar en la sangre en [place]. Hgase las pruebas de deteccin: Cada tres aos despus de los 45 aos de edad si tiene un peso normal y un bajo riesgo de padecer diabetes. Con ms frecuencia y a partir de [place] edad inferior si tiene sobrepeso o un alto riesgo de padecer diabetes. Qu debo saber sobre la prevencin de infecciones? Hepatitis B Si tiene un riesgo ms alto de contraer hepatitis B, debe someterse a un examen de deteccin de este virus. Hable con el mdico para averiguar si tiene riesgo de contraer la infeccin por hepatitis B. Hepatitis C Se recomienda un anlisis de [place] para: Todos los que nacieron entre 1945 y [phone]. Todas las personas que tengan un riesgo de haber contrado hepatitis C. Enfermedades de transmisin sexual (ETS) Debe realizarse pruebas de deteccin de ITS todos los aos, incluidas la gonorrea y la clamidia, si: Es sexualmente activo y es menor de [address]. Es mayor de [address], y Public affairs consultant informa que corre riesgo de tener este tipo de infecciones. La actividad sexual ha cambiado desde que le hicieron la ltima prueba de deteccin y tiene un riesgo mayor de Warehouse manager clamidia o Copy. Pregntele al mdico si usted tiene riesgo. Pregntele al mdico si usted tiene un alto riesgo de Primary school teacher VIH. El mdico tambin puede recomendarle un medicamento recetado para ayudar a evitar la infeccin por el VIH. Si elige tomar medicamentos para prevenir el VIH, primero debe [employer] de deteccin del VIH. Luego debe hacerse anlisis  cada 3 meses mientras est tomando los medicamentos. Siga estas indicaciones en su casa: Consumo de alcohol No beba alcohol si el mdico se lo prohbe. Si bebe alcohol: Limite la cantidad que consume de 0 a 2 bebidas por da. Sepa cunta cantidad de alcohol hay en las bebidas que toma. En los [address], una medida equivale a una botella de cerveza de 12 oz (355 ml), un vaso de vino de 5 oz (148 ml) o un vaso de una bebida alcohlica de alta graduacin de 1 oz (44 ml). Estilo de vida No consuma ningn producto que contenga nicotina o tabaco. Estos productos incluyen cigarrillos, tabaco para Theatre manager y aparatos de vapeo, como los Administrator, Civil Service. Si necesita ayuda para dejar de consumir estos productos, consulte al mdico. No consuma drogas. No comparta agujas. Solicite ayuda a su mdico si necesita apoyo o informacin para abandonar las drogas. Indicaciones generales Realcese los estudios de rutina de [address], dentales y de Wellsite geologist. Mantngase al da con las vacunas. Infrmele a su mdico si: Se siente deprimido con frecuencia. Alguna vez ha sido vctima de [place] o no se siente seguro en su casa. Resumen Adoptar un estilo de vida saludable y recibir atencin preventiva son importantes para promover la salud y Counsellor. Siga las instrucciones del mdico acerca de una dieta saludable, el ejercicio y la realizacin de pruebas o exmenes para Hotel manager. Siga las instrucciones del mdico con respecto al control del colesterol y la presin arterial. Esta informacin no tiene Theme park manager el consejo del mdico. Asegrese de hacerle al mdico cualquier pregunta que tenga. Document Revised: 01/22/2021 Document Reviewed: 01/22/2021 Elsevier Patient Education  2024 Elsevier Inc.       Edwina Barth, MD [place]  [facility]

## 2023-03-08 NOTE — Assessment & Plan Note (Signed)
BP Readings from Last 3 Encounters:  03/08/23 132/80  12/02/21 138/74  01/30/21 130/84  Well-controlled hypertension Continue amlodipine 5 mg daily Cardiovascular risks associated with hypertension and diabetes discussed Diet and nutrition discussed Diet controlled diabetes

## 2023-05-09 IMAGING — US US SOFT TISSUE HEAD/NECK
1 series · 13 of 15 positions shown · non-contrast
Comparison: None.
COMPARISON: None.

Addendum:
CLINICAL DATA: Palpable masses of the neck for 2 years

EXAM:
ULTRASOUND OF HEAD/NECK SOFT TISSUES
TECHNIQUE: Ultrasound examination of the head and neck soft tissues was
performed in the area of clinical concern.

[Series 1: us soft tissue head/neck · 0.07mm/px · 13 of 15 slices shown]
[im 1/15]
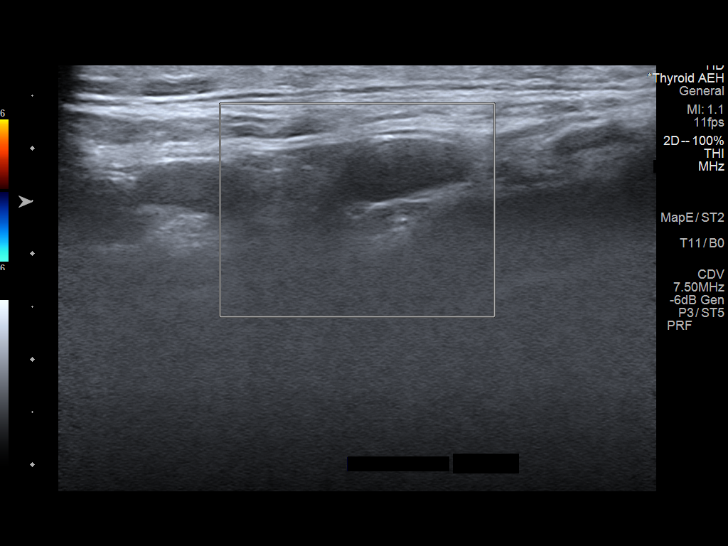
[im 2/15]
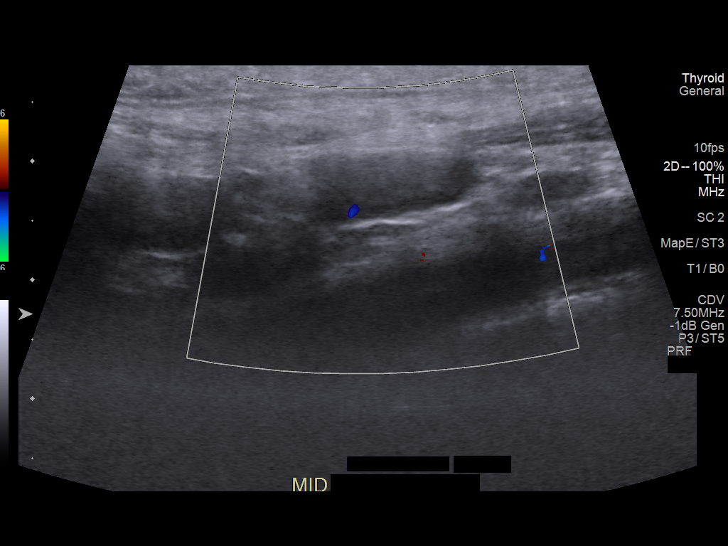
[im 3/15]
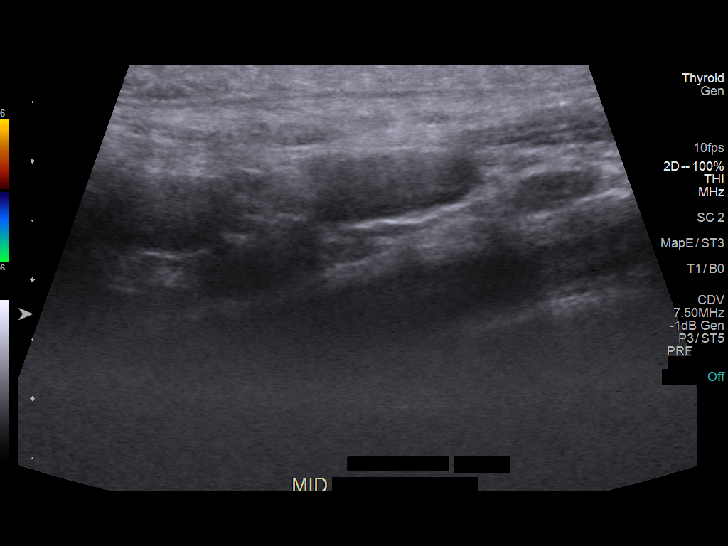
[im 5/15]
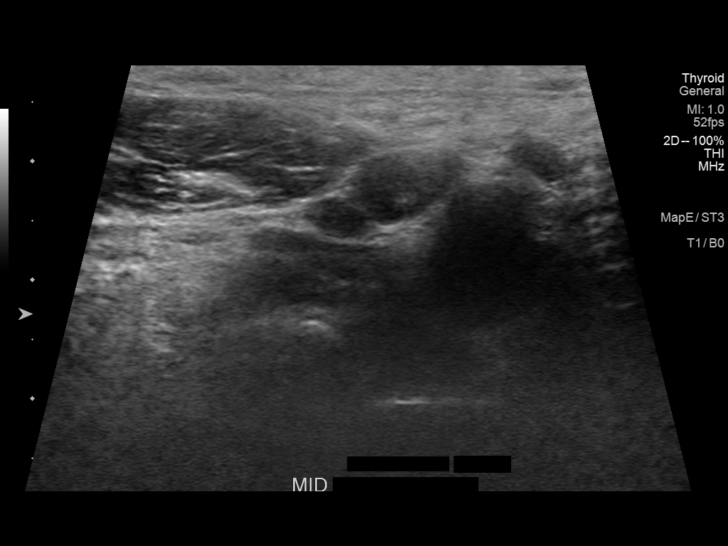
[im 6/15]
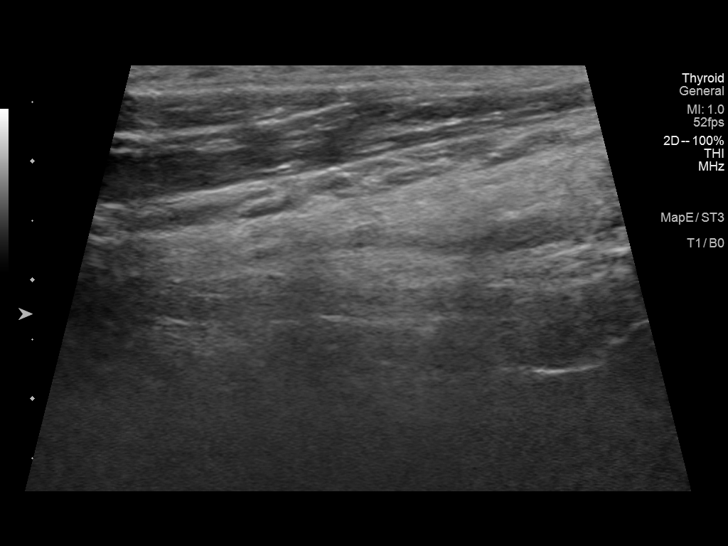
[im 7/15]
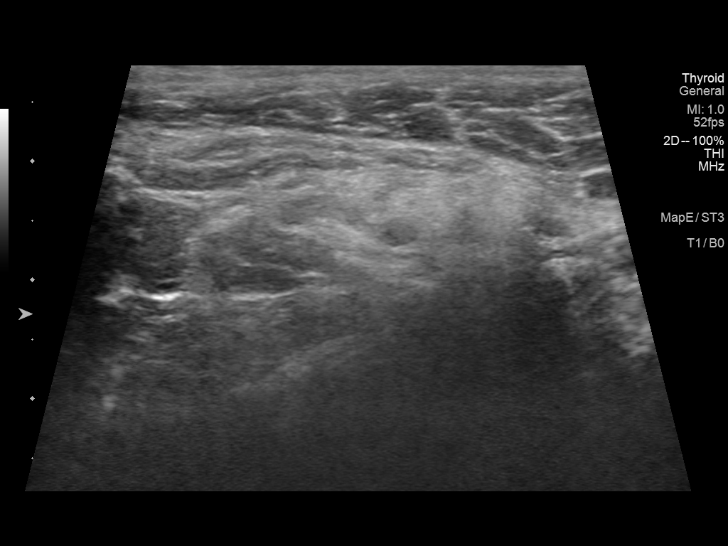
[im 8/15]
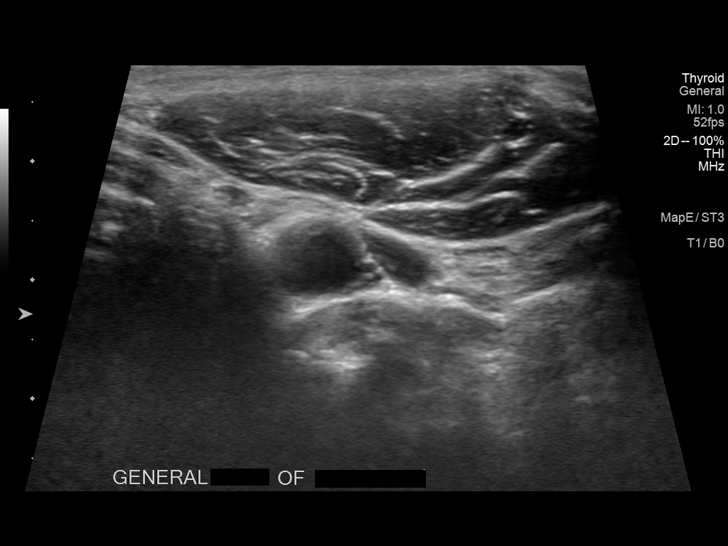
[im 9/15]
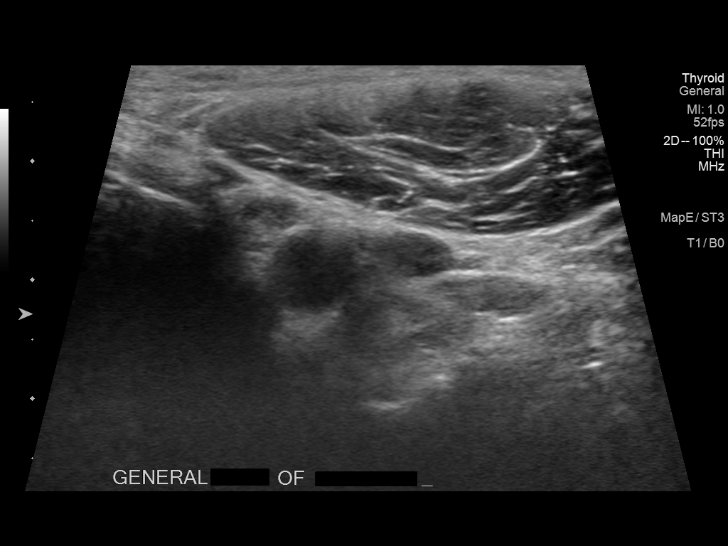
[im 10/15]
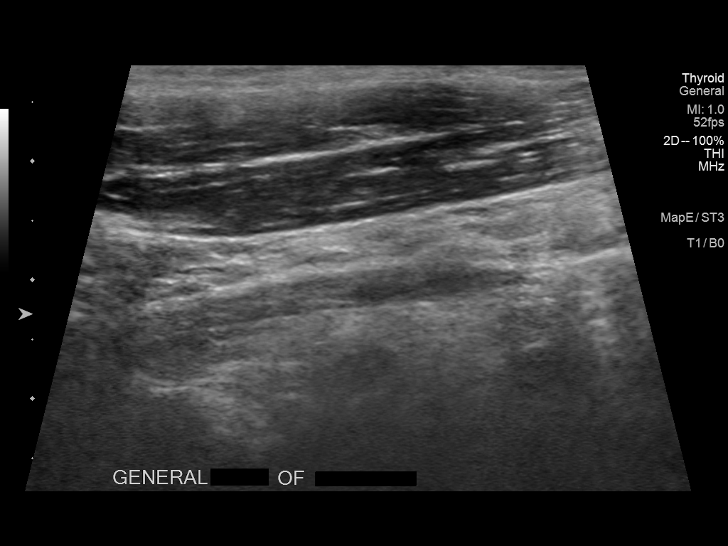
[im 11/15]
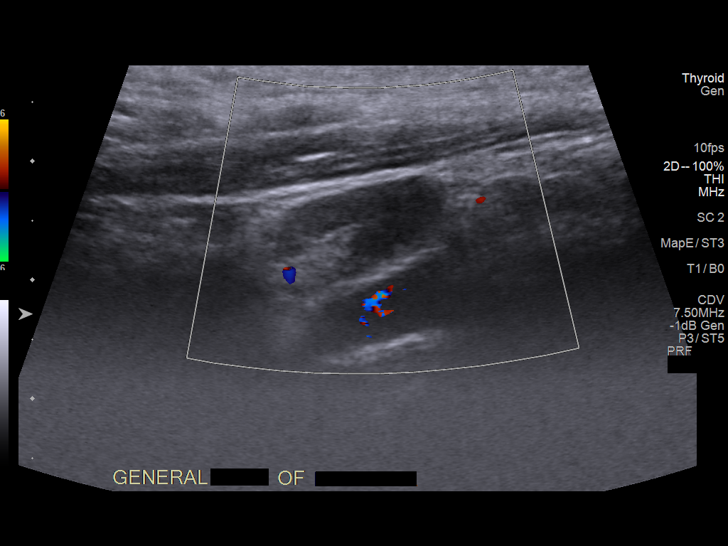
[im 13/15]
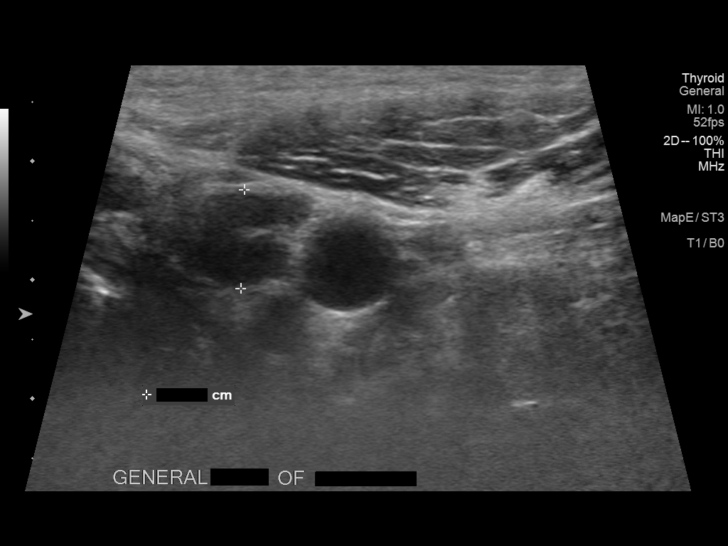
[im 14/15]
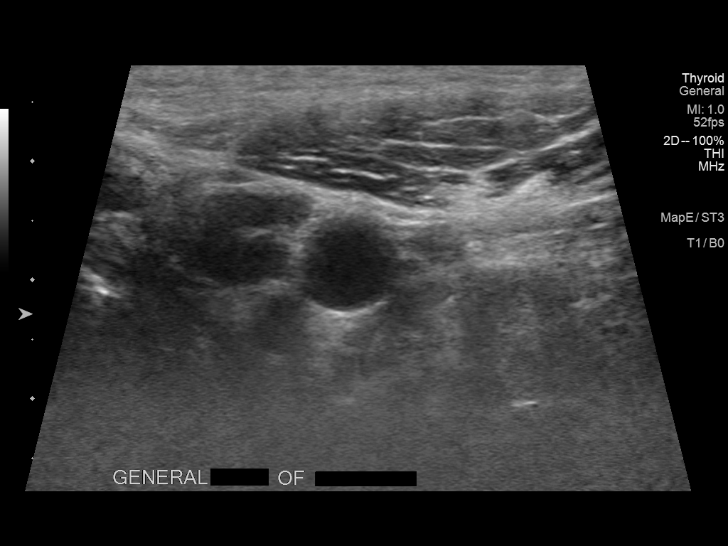
[im 15/15]
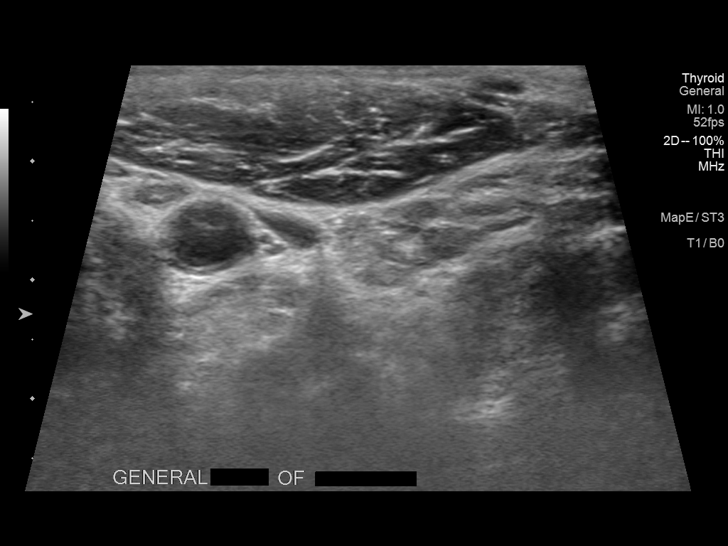

[13 of 15 positions shown; findings below may reference images not displayed]

FINDINGS: The first lesion is in the anterior right mid neck. This measures
0.6 cm and has the appearance of abnormal lymph node.

The second lesion is in the anterior mid left neck. This measures
0.8 cm. The shape is reniform with a fatty hilum.
IMPRESSION: Palpable sites at the anterior neck, most compatible with benign
lymph nodes.

ADDENDUM:
This study was reviewed in person with ENT Dr. Ladan Dipietro on
03/25/2021 at 0373 hours. Both the right and left neck lymph nodes
identified on this ultrasound exam appear normal, physiologic.

Therefore, I strongly suspect the 2nd sentence of the 1st findings
paragraph is supposed to read:

" This measures 0.6 cm and has the appearance of A NORMAL lymph
node."

*** End of Addendum ***
FINDINGS: The first lesion is in the anterior right mid neck. This measures
0.6 cm and has the appearance of abnormal lymph node.

The second lesion is in the anterior mid left neck. This measures
0.8 cm. The shape is reniform with a fatty hilum.
IMPRESSION: Palpable sites at the anterior neck, most compatible with benign
lymph nodes.

## 2023-08-09 DIAGNOSIS — Z1330 Encounter for screening examination for mental health and behavioral disorders, unspecified: Secondary | ICD-10-CM | POA: Diagnosis not present

## 2023-08-09 DIAGNOSIS — R0602 Shortness of breath: Secondary | ICD-10-CM | POA: Diagnosis not present

## 2023-08-09 DIAGNOSIS — R002 Palpitations: Secondary | ICD-10-CM | POA: Diagnosis not present

## 2023-08-09 DIAGNOSIS — I10 Essential (primary) hypertension: Secondary | ICD-10-CM | POA: Diagnosis not present

## 2023-08-13 DIAGNOSIS — R002 Palpitations: Secondary | ICD-10-CM | POA: Diagnosis not present

## 2023-08-20 DIAGNOSIS — R002 Palpitations: Secondary | ICD-10-CM | POA: Diagnosis not present

## 2023-10-05 ENCOUNTER — Encounter: Payer: Self-pay | Admitting: Emergency Medicine

## 2023-10-05 ENCOUNTER — Ambulatory Visit: Payer: 59 | Admitting: Emergency Medicine

## 2023-10-05 VITALS — BP 122/82 | HR 69 | Temp 97.8°F | Ht 69.0 in | Wt 172.0 lb

## 2023-10-05 DIAGNOSIS — B169 Acute hepatitis B without delta-agent and without hepatic coma: Secondary | ICD-10-CM | POA: Insufficient documentation

## 2023-10-05 DIAGNOSIS — E1159 Type 2 diabetes mellitus with other circulatory complications: Secondary | ICD-10-CM

## 2023-10-05 DIAGNOSIS — I152 Hypertension secondary to endocrine disorders: Secondary | ICD-10-CM

## 2023-10-05 DIAGNOSIS — Z113 Encounter for screening for infections with a predominantly sexual mode of transmission: Secondary | ICD-10-CM | POA: Diagnosis not present

## 2023-10-05 LAB — CBC WITH DIFFERENTIAL/PLATELET
Basophils Absolute: 0 10*3/uL (ref 0.0–0.1)
Basophils Relative: 0.6 % (ref 0.0–3.0)
Eosinophils Absolute: 0.1 10*3/uL (ref 0.0–0.7)
Eosinophils Relative: 1.1 % (ref 0.0–5.0)
HCT: 37.3 % — ABNORMAL LOW (ref 39.0–52.0)
Hemoglobin: 12.3 g/dL — ABNORMAL LOW (ref 13.0–17.0)
Lymphocytes Relative: 38.4 % (ref 12.0–46.0)
Lymphs Abs: 2.2 10*3/uL (ref 0.7–4.0)
MCHC: 33 g/dL (ref 30.0–36.0)
MCV: 85.4 fL (ref 78.0–100.0)
Monocytes Absolute: 0.6 10*3/uL (ref 0.1–1.0)
Monocytes Relative: 10 % (ref 3.0–12.0)
Neutro Abs: 2.8 10*3/uL (ref 1.4–7.7)
Neutrophils Relative %: 49.9 % (ref 43.0–77.0)
Platelets: 301 10*3/uL (ref 150.0–400.0)
RBC: 4.37 Mil/uL (ref 4.22–5.81)
RDW: 17.4 % — ABNORMAL HIGH (ref 11.5–15.5)
WBC: 5.7 10*3/uL (ref 4.0–10.5)

## 2023-10-05 LAB — COMPREHENSIVE METABOLIC PANEL
ALT: 1947 U/L — ABNORMAL HIGH (ref 0–53)
AST: 891 U/L — ABNORMAL HIGH (ref 0–37)
Albumin: 4.1 g/dL (ref 3.5–5.2)
Alkaline Phosphatase: 151 U/L — ABNORMAL HIGH (ref 39–117)
BUN: 10 mg/dL (ref 6–23)
CO2: 29 meq/L (ref 19–32)
Calcium: 9.4 mg/dL (ref 8.4–10.5)
Chloride: 98 meq/L (ref 96–112)
Creatinine, Ser: 0.65 mg/dL (ref 0.40–1.50)
GFR: 98.3 mL/min (ref >60.00)
Glucose, Bld: 147 mg/dL — ABNORMAL HIGH (ref 70–99)
Potassium: 4 meq/L (ref 3.5–5.1)
Sodium: 134 meq/L — ABNORMAL LOW (ref 135–145)
Total Bilirubin: 11.6 mg/dL — ABNORMAL HIGH (ref 0.2–1.2)
Total Protein: 7.6 g/dL (ref 6.0–8.3)

## 2023-10-05 LAB — LIPASE: Lipase: 65 U/L — ABNORMAL HIGH (ref 11.0–59.0)

## 2023-10-05 LAB — HEMOGLOBIN A1C: Hgb A1c MFr Bld: 5.9 % (ref 4.6–6.5)

## 2023-10-05 NOTE — Progress Notes (Signed)
 Erik Mcgee 68 y.o.   Chief Complaint  Patient presents with   GI Problem    Pt states when out of country and started stomach issues and saw GI dr back home and told him had diagnostic inflammation in gall bladder. Patient symptoms is getting very bloated, discomforts in stomach. Patient does not have pain, or diarrhea. He was given 3 meds Epidial, tavin and he does not know the last one. He does say it helps. Pt c/o his feels his hear beating really hard when he wakes up but last night he felt it before going to bed. Patient eyes and hands are yellow this is new to the pt     HISTORY OF PRESENT ILLNESS: This is a 68 y.o. male recently diagnosed with acute hepatitis B. Travel to [place] last January 16th.  Started feeling not well 1 week earlier.  Went to see Dr. Agustina over in [place] and had extensive workup which included gallbladder ultrasound, blood work, MRI abdomen, stool and urine samples.  Blood work shows acute hepatitis B virus infection.  Negative HIV.  Normal pancreas.  No gallstones. Clinical stable.  Able to eat and drink.  Denies fever or chills.  Denies nausea or vomiting. Patient unsure how he got this.  GI Problem The primary symptoms include abdominal pain. Primary symptoms do not include fever, nausea, vomiting, diarrhea, melena, dysuria or rash.  The illness does not include chills.     Prior to Admission medications   Medication Sig Start Date End Date Taking? Authorizing Provider  amLODipine  (NORVASC ) 5 MG tablet Take 1 tablet by mouth once daily 12/21/22  Yes Randalyn Ahmed, Emil Schanz, MD  rosuvastatin  (CRESTOR ) 5 MG tablet Take 1 tablet (5 mg total) by mouth daily. Patient not taking: Reported on 10/05/2023 09/20/19   Livio Ledwith Jose, MD  tadalafil  (ADCIRCA /CIALIS ) 20 MG tablet Take 0.5-1 tablets (10-20 mg total) by mouth every other day as needed for erectile dysfunction. Patient not taking: Reported on 10/05/2023 07/05/18   [name] Emil Schanz, MD    No Known Allergies  Patient Active Problem List   Diagnosis Date Noted   Foamy urine 03/08/2023   Statin intolerance 03/19/2020   Snoring 07/05/2018   Erectile dysfunction 07/05/2018   History of gastroesophageal reflux (GERD) 09/10/2017   Hypertension associated with diabetes (HCC) 07/28/2017    Past Medical History:  Diagnosis Date   Hypertension     No past surgical history on file.  Social History   Socioeconomic History   Marital status: Married    Spouse name: Not on file   Number of children: Not on file   Years of education: Not on file   Highest education level: Not on file  Occupational History   Not on file  Tobacco Use   Smoking status: Never   Smokeless tobacco: Never  Substance and Sexual Activity   Alcohol use: No   Drug use: No   Sexual activity: Not on file  Other Topics Concern   Not on file  Social History Narrative   Not on file   Social Drivers of Health   Financial Resource Strain: Low Risk  (08/09/2023)   Received from [facility]   Overall Financial Resource Strain (CARDIA)    Difficulty of Paying Living Expenses: Not hard at all  Food Insecurity: No Food Insecurity (08/09/2023)   Received from [facility]   Hunger Vital Sign    Worried About Running Out of Food in the Last Year: Never  true    Ran Out of Food in the Last Year: Never true  Transportation Needs: No Transportation Needs (08/09/2023)   Received from [facility]   PRAPARE - Transportation    Lack of Transportation (Medical): No    Lack of Transportation (Non-Medical): No  Physical Activity: Not on file  Stress: Not on file  Social Connections: Unknown (01/09/2022)   Received from [facility], [facility]   Social Network    Social Network: Not on file  Intimate Partner Violence: Unknown (12/01/2021)   Received from [facility], [facility]   HITS    Physically Hurt: Not on file    Insult or Talk Down To: Not on file    Threaten Physical  Harm: Not on file    Scream or Curse: Not on file    Family History  Problem Relation Age of Onset   Cancer Mother        melamona   Heart disease Father      Review of Systems  Constitutional: Negative.  Negative for chills and fever.  HENT: Negative.  Negative for congestion and sore throat.   Respiratory: Negative.  Negative for cough and shortness of breath.   Cardiovascular: Negative.  Negative for chest pain and palpitations.  Gastrointestinal:  Positive for abdominal pain. Negative for blood in stool, diarrhea, melena, nausea and vomiting.  Genitourinary: Negative.  Negative for dysuria and hematuria.  Skin: Negative.  Negative for rash.  Neurological: Negative.  Negative for dizziness and headaches.  All other systems reviewed and are negative.   Vitals:   10/05/23 0835  BP: 122/82   Today's Vitals   10/05/23 0835  BP: 122/82  Pulse: 69  Temp: 97.8 F (36.6 C)  TempSrc: Oral  SpO2: 99%  Weight: 172 lb (78 kg)  Height: 5' 9 (1.753 m)   Body mass index is 25.4 kg/m.   Physical Exam Vitals reviewed.  Constitutional:      Appearance: Normal appearance.  HENT:     Head: Normocephalic.     Mouth/Throat:     Mouth: Mucous membranes are moist.     Pharynx: Oropharynx is clear.  Eyes:     General: Scleral icterus present.     Extraocular Movements: Extraocular movements intact.     Conjunctiva/sclera: Conjunctivae normal.     Pupils: Pupils are equal, round, and reactive to light.  Cardiovascular:     Rate and Rhythm: Normal rate and regular rhythm.     Pulses: Normal pulses.     Heart sounds: Normal heart sounds.  Pulmonary:     Effort: Pulmonary effort is normal.  Abdominal:     Palpations: Abdomen is soft.     Tenderness: There is no abdominal tenderness.  Skin:    General: Skin is warm and dry.     Coloration: Skin is jaundiced.  Neurological:     Mental Status: He is alert and oriented to person, place, and time.  Psychiatric:        Mood  and Affect: Mood normal.        Behavior: Behavior normal.      ASSESSMENT & PLAN: A total of 44 minutes was spent with the patient and counseling/coordination of care regarding preparing for this visit, review of most recent office visit notes, review of medical records from Dr.'s office in [place], review of recent diagnostic imaging reports,  review of multiple chronic medical conditions and their management, recent diagnosis of hepatitis B virus infection and management,  review of all medications, review of most recent bloodwork results, review of health maintenance items, education on nutrition, prognosis, documentation, and need for follow up with me after infectious disease evaluation.  Problem List Items Addressed This Visit       Cardiovascular and Mediastinum   Hypertension associated with diabetes (HCC)   BP Readings from Last 3 Encounters:  10/05/23 122/82  03/08/23 132/80  12/02/21 138/74  Well-controlled hypertension Continue Lidopin 5 mg daily Hemoglobin A1c done today. Not on diabetic medication       Relevant Orders   Hemoglobin A1c     Digestive   Acute hepatitis B virus infection - Primary   Acute stage.  No complications.  Running its course. Clinically stable.  Jaundice on physical examination Recent liver enzymes done in [place] in the thousands Positive hepatitis B serology markers Able to eat and drink.  Afebrile We will repeat blood work today Repeat right upper quadrant abdominal ultrasound Diet and nutrition discussed Recommend evaluation by infectious disease doctor Referral placed today We will follow-up in 4 weeks      Relevant Orders   STI Profile   CBC with Differential/Platelet   Comprehensive metabolic panel   Lipase   US  Abdomen Limited RUQ (LIVER/GB)   Ambulatory referral to Infectious Disease   Patient Instructions  Hepatitis B: qu debe saber Hepatitis B: What to Know  La hepatitis B es una infeccin en  el hgado causada por un germen, al que tambin se le conoce como virus. El germen se puede transmitir de ignacia persona a photographer. Esto significa que es contagiosa. Esta infeccin causa irritacin e hinchazn en el hgado. Hay dos clases de hepatitis B: Hepatitis B aguda. Puede durar 6 meses o menos. Hepatitis B a largo plazo o crnica. Esta dura ms de 6 meses. Puede causar problemas hepticos graves. La mayora de los adultos con hepatitis B aguda no contraen hepatitis B crnica. Los bebs y los nios pequeos que contraen hepatitis B tienen ms probabilidades que los adultos de que la hepatitis B se convierta en crnica. Cules son las causas? La causa de esta afeccin es un germen. El germen se propaga a travs del contacto con la sangre u otros lquidos corporales, por ejemplo: Semen. Fluidos vaginales. Leche materna. El germen no se propaga cuando una persona tose o estornuda. Qu incrementa el riesgo? Tener contacto con agujas o jeringas que tienen el germen. Esto puede pasar si: Se inyecta drogas. Se hace un tatuaje o un piercing corporal. Recibe un tratamiento en el que le colocan agujas finas en la piel. Esto se llama acupuntura. No usa  preservativo durante las relaciones sexuales vaginales, anales u orales. Vive o est en contacto cercano con una persona que tiene hepatitis B. Tiene un trabajo donde tiene contacto con sangre o lquidos corporales, como en la atencin de la salud. Viaja a un pas donde esta afeccin es frecuente. Recibe dilisis renal. Este es un tratamiento para limpiar la sangre. Ha recibido sangre donada (transfusin de sangre) o un trasplante de rgano. Cules son los signos o sntomas? No querer comer tanto como lo hace normalmente. [place]. Cansancio. Vomitar o sentir ganas de vomitar. Dolor en el vientre o en las articulaciones. Orina de color amarillo oscuro, material fecal de color claro o ambos. Ictericia. Esto es cuando la piel y la parte blanca de  los ojos se ponen designer, television/film set. Picazn en la piel. A menudo, no hay sntomas. Cmo se diagnostica? Esta afeccin se diagnostica en funcin de  lo siguiente: Un examen fsico. Sus antecedentes mdicos. Anlisis de [place]. Cmo se trata? El tratamiento puede depender de su afeccin y de si usted tiene dao heptico. Este puede incluir tomar medicamentos antivirales. Esto puede ayudarlo de la siguiente manera: Reducir el riesgo de problemas hepticos graves. Reducir el riesgo de que transmita el germen a economist. Tendr que evitar ciertos medicamentos que pueden causar dao heptico. Siga estas instrucciones en su casa: Medicamentos Tome los medicamentos nicamente como se lo hayan indicado. Si le han recetado un medicamento antiviral, tmelo como se lo hayan indicado. No deje de tomarlo aunque comience a sentirse mejor. No tome: Medicamentos de venta libre que contengan acetaminofeno. Medicamentos nuevos, a menos que el mdico lo autorice. Esto incluye los suplementos y los medicamentos de [place]. Actividad Descanse todo lo que sea necesario. Use un preservativo cada vez que tenga sexo vaginal, oral o anal. Pregunte qu actividades de la casa son seguras para que haga. Pregunte cundo puede volver al trabajo o a la escuela. Evite nadar y usar jacuzzis hasta que el mdico le diga que puede [place]. Comida y bebida Beba ms lquido segn se lo hayan indicado. No beba alcohol. Si siente ganas de vomitar o vomita, pruebe lo siguiente: En la medida en que pueda, consuma alimentos blandos y fciles de digerir en pequeas cantidades. Estos alimentos incluyen bananas, compota de [place], arroz, carnes [place], tostadas y [address]. En la medida en que pueda, beba lquidos transparentes lentamente y en pequeas cantidades. Los lquidos transparentes incluyen agua, bebidas deportivas bajas en caloras y jugo de fruta rebajado con agua (jugo de fruta diluido). Tambin puede chupar  trocitos de hielo. Instrucciones generales No comparta cepillos de dientes, alicates para las uas o afeitadoras. Lvese las manos frecuentemente con agua y jabn durante al menos 20 segundos. Si no dispone de agua y jabn, use desinfectante para manos con alcohol. Asegrese de lavarse las manos: Despus de ir al bao. Despus de cambiar paales. Antes de tocar alimentos o agua. Informe a su mdico con cuntas personas vive o tiene contacto cercano. Es posible que necesiten la vacuna contra la hepatitis B. Cubra todo corte o llaga abierta de la piel. Esto evita que usted propague el germen. Concurra a todas las visitas de seguimiento. Es posible que deba hacerse pruebas para asegurarse de que el hgado est sanando. Dnde obtener ms informacin Para obtener ms informacin, visite estos sitios web: Marine Scientist for [employer] and Prevention (Centros para el Control y la Prevencin de [place]) en tonerpromos.no. LuegoBETHA Lanius clic en "Search" Armed Forces Technical Officer) y escriba "hepatitis B". Encuentre el enlace que necesita. World Science Writer (Organizacin Mundial de la Salud): [email or web address]://[email or web address]-[email or web address]/ Comunquese con un mdico si: Sus sntomas empeoran. Aparecen nuevos sntomas. Esta informacin no tiene theme park manager el consejo del mdico. Asegrese de hacerle al mdico cualquier pregunta que tenga. Document Revised: 04/22/2023 Document Reviewed: 04/22/2023 Elsevier Patient Education  2024 Elsevier Inc.     Emil Schaumann, MD [facility]

## 2023-10-05 NOTE — Assessment & Plan Note (Signed)
BP Readings from Last 3 Encounters:  10/05/23 122/82  03/08/23 132/80  12/02/21 138/74  Well-controlled hypertension Continue Lidopin 5 mg daily Hemoglobin A1c done today. Not on diabetic medication

## 2023-10-05 NOTE — Patient Instructions (Signed)
 Hepatitis B: qu debe saber Hepatitis B: What to Know  La hepatitis B es una infeccin en el hgado causada por un germen, al que tambin se le conoce como virus. El germen se puede transmitir de ignacia persona a photographer. Esto significa que es contagiosa. Esta infeccin causa irritacin e hinchazn en el hgado. Hay dos clases de hepatitis B: Hepatitis B aguda. Puede durar 6 meses o menos. Hepatitis B a largo plazo o crnica. Esta dura ms de 6 meses. Puede causar problemas hepticos graves. La mayora de los adultos con hepatitis B aguda no contraen hepatitis B crnica. Los bebs y los nios pequeos que contraen hepatitis B tienen ms probabilidades que los adultos de que la hepatitis B se convierta en crnica. Cules son las causas? La causa de esta afeccin es un germen. El germen se propaga a travs del contacto con la sangre u otros lquidos corporales, por ejemplo: Semen. Fluidos vaginales. Leche materna. El germen no se propaga cuando una persona tose o estornuda. Qu incrementa el riesgo? Tener contacto con agujas o jeringas que tienen el germen. Esto puede pasar si: Se inyecta drogas. Se hace un tatuaje o un piercing corporal. Recibe un tratamiento en el que le colocan agujas finas en la piel. Esto se llama acupuntura. No usa  preservativo durante las relaciones sexuales vaginales, anales u orales. Vive o est en contacto cercano con una persona que tiene hepatitis B. Tiene un trabajo donde tiene contacto con sangre o lquidos corporales, como en la atencin de la salud. Viaja a un pas donde esta afeccin es frecuente. Recibe dilisis renal. Este es un tratamiento para limpiar la sangre. Ha recibido sangre donada (transfusin de sangre) o un trasplante de rgano. Cules son los signos o sntomas? No querer comer tanto como lo hace normalmente. Lajune. Cansancio. Vomitar o sentir ganas de vomitar. Dolor en el vientre o en las articulaciones. Orina de color amarillo oscuro,  material fecal de color claro o ambos. Ictericia. Esto es cuando la piel y la parte blanca de los ojos se ponen designer, television/film set. Picazn en la piel. A menudo, no hay sntomas. Cmo se diagnostica? Esta afeccin se diagnostica en funcin de lo siguiente: Un examen fsico. Sus antecedentes mdicos. Anlisis de [place]. Cmo se trata? El tratamiento puede depender de su afeccin y de si usted tiene dao heptico. Este puede incluir tomar medicamentos antivirales. Esto puede ayudarlo de la siguiente manera: Reducir el riesgo de problemas hepticos graves. Reducir el riesgo de que transmita el germen a economist. Tendr que evitar ciertos medicamentos que pueden causar dao heptico. Siga estas instrucciones en su casa: Medicamentos Tome los medicamentos nicamente como se lo hayan indicado. Si le han recetado un medicamento antiviral, tmelo como se lo hayan indicado. No deje de tomarlo aunque comience a sentirse mejor. No tome: Medicamentos de venta libre que contengan acetaminofeno. Medicamentos nuevos, a menos que el mdico lo autorice. Esto incluye los suplementos y los medicamentos de [place]. Actividad Descanse todo lo que sea necesario. Use un preservativo cada vez que tenga sexo vaginal, oral o anal. Pregunte qu actividades de la casa son seguras para que haga. Pregunte cundo puede volver al trabajo o a la escuela. Evite nadar y usar jacuzzis hasta que el mdico le diga que puede [place]. Comida y bebida Beba ms lquido segn se lo hayan indicado. No beba alcohol. Si siente ganas de vomitar o vomita, pruebe lo siguiente: En la medida en que pueda, consuma alimentos blandos y fciles de location manager en pequeas  cantidades. Estos alimentos incluyen bananas, compota de [place], arroz, carnes [place], tostadas y [address]. En la medida en que pueda, beba lquidos transparentes lentamente y en pequeas cantidades. Los lquidos transparentes incluyen agua, bebidas deportivas  bajas en caloras y jugo de fruta rebajado con agua (jugo de fruta diluido). Tambin puede chupar trocitos de hielo. Instrucciones generales No comparta cepillos de dientes, alicates para las uas o afeitadoras. Lvese las manos frecuentemente con agua y jabn durante al menos 20 segundos. Si no dispone de agua y jabn, use desinfectante para manos con alcohol. Asegrese de lavarse las manos: Despus de ir al bao. Despus de cambiar paales. Antes de tocar alimentos o agua. Informe a su mdico con cuntas personas vive o tiene contacto cercano. Es posible que necesiten la vacuna contra la hepatitis B. Cubra todo corte o llaga abierta de la piel. Esto evita que usted propague el germen. Concurra a todas las visitas de seguimiento. Es posible que deba hacerse pruebas para asegurarse de que el hgado est sanando. Dnde obtener ms informacin Para obtener ms informacin, visite estos sitios web: Marine Scientist for [employer] and Prevention (Centros para el Control y la Prevencin de [place]) en tonerpromos.no. LuegoBETHA Lanius clic en "Search" Armed Forces Technical Officer) y escriba "hepatitis B". Encuentre el enlace que necesita. World Science Writer (Organizacin Mundial de la Salud): [email or web address]://[email or web address]/ Comunquese con un mdico si: Sus sntomas empeoran. Aparecen nuevos sntomas. Esta informacin no tiene theme park manager el consejo del mdico. Asegrese de hacerle al mdico cualquier pregunta que tenga. Document Revised: 04/22/2023 Document Reviewed: 04/22/2023 Elsevier Patient Education  2024 [employer].

## 2023-10-05 NOTE — Assessment & Plan Note (Signed)
 Acute stage.  No complications.  Running its course. Clinically stable.  Jaundice on physical examination Recent liver enzymes done in [place] in the thousands Positive hepatitis B serology markers Able to eat and drink.  Afebrile We will repeat blood work today Repeat right upper quadrant abdominal ultrasound Diet and nutrition discussed Recommend evaluation by infectious disease doctor Referral placed today We will follow-up in 4 weeks

## 2023-10-06 LAB — HEPATITIS B SURFACE AG, CONFIRM: HBsAG Confirmation: POSITIVE — AB

## 2023-10-06 LAB — STI PROFILE
HCV Ab: NONREACTIVE
HIV Screen 4th Generation wRfx: NONREACTIVE
Hep B Core Total Ab: POSITIVE — AB
Hep B Surface Ab, Qual: NONREACTIVE
RPR Ser Ql: NONREACTIVE

## 2023-10-06 LAB — HEPATITIS B CORE ANTIBODY, IGM: Hep B C IgM: POSITIVE — AB

## 2023-10-06 LAB — HCV INTERPRETATION

## 2023-10-07 ENCOUNTER — Ambulatory Visit
Admission: RE | Admit: 2023-10-07 | Discharge: 2023-10-07 | Disposition: A | Discharge status: Home / Self Care | Payer: Medicare HMO | Source: Ambulatory Visit | Attending: Emergency Medicine | Admitting: Emergency Medicine

## 2023-10-07 ENCOUNTER — Encounter: Payer: Self-pay | Admitting: Emergency Medicine

## 2023-10-07 DIAGNOSIS — B169 Acute hepatitis B without delta-agent and without hepatic coma: Secondary | ICD-10-CM

## 2023-10-07 DIAGNOSIS — R945 Abnormal results of liver function studies: Secondary | ICD-10-CM | POA: Diagnosis not present

## 2023-10-12 ENCOUNTER — Telehealth: Payer: Self-pay

## 2023-10-12 ENCOUNTER — Encounter: Payer: Self-pay | Admitting: Internal Medicine

## 2023-10-12 ENCOUNTER — Other Ambulatory Visit (HOSPITAL_COMMUNITY): Payer: Self-pay

## 2023-10-12 ENCOUNTER — Ambulatory Visit: Payer: Medicare HMO | Admitting: Internal Medicine

## 2023-10-12 ENCOUNTER — Other Ambulatory Visit: Payer: Self-pay

## 2023-10-12 VITALS — BP 129/81 | HR 68 | Temp 97.7°F | Wt 174.0 lb

## 2023-10-12 DIAGNOSIS — B169 Acute hepatitis B without delta-agent and without hepatic coma: Secondary | ICD-10-CM

## 2023-10-12 NOTE — Patient Instructions (Signed)
Continue tenofovir  I'll see you in 6 weeks to recheck labs   Depending on what your labs look like near end of summer will see if we need to continue tenofovir

## 2023-10-12 NOTE — Telephone Encounter (Signed)
[facility] Patient Advocate Encounter  Insurance verification completed.    The patient is insured through [employer]. Patient has [employer], may use a copay card, and/or apply for patient assistance if available.    Ran test claim for [id number]   The current 30 day co-pay is $605.61.  Ran test claim for [id number]  The current 30 day co-pay is $26.22.  Ran test claim for [id number]  The current 30 day co-pay is $9.00.  Try TENOFOVIR DISOPROXIL FUMARATE,ENTECAVIR PER REJECTION (CHEAPER ROUTES)  We will continue to follow to see if copay assistance is needed.  This test claim was processed through [facility]- copay amounts may vary at other pharmacies due to pharmacy/plan contracts, or as the patient moves through the different stages of their insurance plan.

## 2023-10-12 NOTE — Progress Notes (Signed)
[facility]  Reason for Consult:hepatitis b infection Referring Provider: Alvy Bimler, miguel (pcp)    Patient Active Problem List   Diagnosis Date Noted   Acute hepatitis B virus infection 10/05/2023   Foamy urine 03/08/2023   Statin intolerance 03/19/2020   Snoring 07/05/2018   Erectile dysfunction 07/05/2018   History of gastroesophageal reflux (GERD) 09/10/2017   Hypertension associated with diabetes (HCC) 07/28/2017      HPI: Erik Mcgee is a 68 y.o. male referred here by pcp for acute hepatitis b infection   [place] and came back 10/02/23. He visited his wife there.  He was feeling bloating/discomfort upper quadrant and subjective fever for the week before going to [place] actually. 2-3 months prior to that he had pruritic rash but nothing visible. He developed scleral icterus and more fever, and increased abd pain when he was in [place]. He went to see a doctor there and was diagnosed with hepatitis b  No n/v/d/malaise  He was started on tenofovir there  He saw his pcp again here on 10/05/23 and hepatitis b labs showed positive sAg, cIGM. 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 He denies having extramarital sex, recent tatoos/blood transfusion. He last saw pcp before this 08/2022 to do physical  Some weight loss 10 pounds the last few weeks, but stable now   Right now no sx   His father has some kind of hepatitis not sure what kind. His mother also has it. He doesn't know if he has hep b in the past. Never donated blood before His dad died of heart attack and his mom died of liver disease No one in family has cirrhosis or liver issue  His sister has hep b and c and cirrhosis and had a transplant  He doesn't drink etoh   He was born in [place];  immigrated to the [place] In 1998   He'll be going back to [place] 02/2024 for dental work.   His wife has hep b but not requiring treatment   No bleeding/bruising    Review of Systems: ROS All other ros negative      Past Medical History:  Diagnosis Date   Hypertension     Social History   Tobacco Use   Smoking status: Never   Smokeless tobacco: Never  Substance Use Topics   Alcohol use: No   Drug use: No    Family History  Problem Relation Age of Onset   Cancer Mother        melamona   Heart disease Father     No Known Allergies  OBJECTIVE: Vitals:   10/12/23 0858  BP: 129/81  Pulse: 68  Temp: 97.7 F (36.5 C)  TempSrc: Oral  SpO2: 100%  Weight: 174 lb (78.9 kg)   Body mass index is 25.7 kg/m.   Physical Exam General/constitutional: no distress, pleasant HEENT: Normocephalic, PER, Conj Clear, EOMI, Oropharynx clear Neck supple CV: rrr no mrg Lungs: clear to auscultation, normal respiratory effort Abd: Soft, Nontender Ext: no edema Skin: No Rash Neuro: nonfocal MSK: no peripheral joint swelling/tenderness/warmth; back spines nontender    Lab: Lab Results  Component Value Date   WBC 5.7 10/05/2023   HGB 12.3 (L) 10/05/2023   HCT 37.3 (L)  10/05/2023   MCV 85.4 10/05/2023   PLT 301.0 10/05/2023   Last metabolic panel Lab Results  Component Value Date   GLUCOSE 147 (H) 10/05/2023   NA 134 (L) 10/05/2023   K 4.0 10/05/2023   CL 98 10/05/2023   CO2 29 10/05/2023   BUN 10 10/05/2023   CREATININE 0.65 10/05/2023   GFR 98.30 10/05/2023   CALCIUM 9.4 10/05/2023   PROT 7.6 10/05/2023   ALBUMIN 4.1 10/05/2023   LABGLOB 2.3 09/30/2020   AGRATIO 2.0 09/30/2020   BILITOT 11.6 (H) 10/05/2023   ALKPHOS 151 (H) 10/05/2023   AST 891 (H) 10/05/2023   ALT 1,947 (H) 10/05/2023    Microbiology:  Serology:  Imaging: Reviewed   10/07/23 liver u/s IMPRESSION: No cholelithiasis or sonographic evidence for acute  cholecystitis.  Assessment/plan: Problem List Items Addressed This Visit     Acute hepatitis B virus infection - Primary   Relevant Medications   tenofovir (VIREAD) 300 MG tablet   Other Relevant Orders   COMPLETE METABOLIC PANEL WITH GFR   CBC   Protime-INR      Discussed with patient this could be chronic hep b with acute flare (given family hx likely with hep b and wife also has chronic hep b)   This could also be acute infection   Sister with hep b/c cirrhosis s/p liver transplant doing well   Patient doing well at this time   I will closely monitor how he does for the next  6 months. If acute hep b hopefully he 'll achieve functional cure.   If functional cure achieved, will discuss titrating off tenofovir (keeping in mind family hx cirrhosis)   If no functional cure will likely keep on tenofovir.   Will also discuss plan to monitor for liver cancer in the future depending on what the hep b status is    No labs today at request as he feels welll   F/u in 6 weeks  Continue tenofovir      Follow-up: Return in about 6 weeks (around 11/23/2023).  Raymondo Band, MD [facility] 10/12/2023, 9:07 AM

## 2023-10-29 DIAGNOSIS — R0602 Shortness of breath: Secondary | ICD-10-CM | POA: Diagnosis not present

## 2023-11-18 ENCOUNTER — Other Ambulatory Visit: Payer: Self-pay

## 2023-11-18 ENCOUNTER — Ambulatory Visit: Payer: Medicare HMO | Admitting: Internal Medicine

## 2023-11-18 ENCOUNTER — Encounter: Payer: Self-pay | Admitting: Internal Medicine

## 2023-11-18 VITALS — BP 137/78 | HR 73 | Temp 97.8°F | Ht 66.0 in | Wt 175.0 lb

## 2023-11-18 DIAGNOSIS — B181 Chronic viral hepatitis B without delta-agent: Secondary | ICD-10-CM

## 2023-11-18 NOTE — Progress Notes (Signed)
 [facility]  Reason for Consult:hepatitis b infection Referring Provider: Alvy Bimler, miguel (pcp)    Patient Active Problem List   Diagnosis Date Noted   Acute hepatitis B virus infection 10/05/2023   Foamy urine 03/08/2023   Statin intolerance 03/19/2020   Snoring 07/05/2018   Erectile dysfunction 07/05/2018   History of gastroesophageal reflux (GERD) 09/10/2017   Hypertension associated with diabetes (HCC) 07/28/2017      HPI: Erik Mcgee is a 68 y.o. male referred here by pcp for acute hepatitis b infection; he is here for periodic follow up chronic hep b   [place] and came back 10/02/23. He visited his wife there.  He was feeling bloating/discomfort upper quadrant and subjective fever for the week before going to [place] actually. 2-3 months prior to that he had pruritic rash but nothing visible. He developed scleral icterus and more fever, and increased abd pain when he was in [place]. He went to see a doctor there and was diagnosed with hepatitis b  No n/v/d/malaise  He was started on tenofovir there  He saw his pcp again here on 10/05/23 and hepatitis b labs showed positive sAg, cIGM. 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 He denies having extramarital sex, recent tatoos/blood transfusion. He last saw pcp before this 08/2022 to do physical  Some weight loss 10 pounds the last few weeks, but stable now   Right now no sx   His father has some kind of hepatitis not sure what kind. His mother also has it. He doesn't know if he has hep b in the past. Never donated blood before His dad died of heart attack and his mom died of liver disease No one in family has cirrhosis or liver issue  His sister has hep b and c and cirrhosis and had a transplant  He doesn't  drink etoh   He was born in [place]; immigrated to the [place]   He'll be going back to [place] 02/2024 for dental work.   His wife has hep b but not requiring treatment   No bleeding/bruising  ------------------- 11/18/23 [facility] f/u See a&P for detail     Review of Systems: ROS All other ros negative      Past Medical History:  Diagnosis Date   Hypertension     Social History   Tobacco Use   Smoking status: Never   Smokeless tobacco: Never  Substance Use Topics   Alcohol use: No   Drug use: No    Family History  Problem Relation Age of Onset   Cancer Mother        melamona   Heart disease Father     No Known Allergies  OBJECTIVE: Vitals:   11/18/23 1546  BP: 137/78  Pulse: 73  Temp: 97.8 F (36.6 C)  TempSrc: Oral  SpO2: 100%  Weight: 175 lb (79.4 kg)  Height: 5\' 6"  (1.676 m)    Body mass index is 28.25 kg/m.   Physical Exam General/constitutional: no distress, pleasant HEENT: Normocephalic, PER, Conj Clear, EOMI, Oropharynx clear Neck supple CV: rrr no mrg Lungs: clear to auscultation, normal respiratory effort Abd: Soft, Nontender Ext: no edema Skin: No Rash Neuro: nonfocal MSK: no peripheral joint swelling/tenderness/warmth; back  spines nontender    Lab: Lab Results  Component Value Date   WBC 5.7 10/05/2023   HGB 12.3 (L) 10/05/2023   HCT 37.3 (L) 10/05/2023   MCV 85.4 10/05/2023   PLT 301.0 10/05/2023   Last metabolic panel Lab Results  Component Value Date   GLUCOSE 147 (H) 10/05/2023   NA 134 (L) 10/05/2023   K 4.0 10/05/2023   CL 98 10/05/2023   CO2 29 10/05/2023   BUN 10 10/05/2023   CREATININE 0.65 10/05/2023   GFR 98.30 10/05/2023   CALCIUM 9.4 10/05/2023   PROT 7.6 10/05/2023   ALBUMIN 4.1 10/05/2023   LABGLOB 2.3 09/30/2020   AGRATIO 2.0 09/30/2020   BILITOT 11.6 (H) 10/05/2023   ALKPHOS 151 (H) 10/05/2023   AST 891 (H) 10/05/2023   ALT 1,947 (H) 10/05/2023     Microbiology:  Serology:  Imaging: Reviewed  10/07/23 liver u/s IMPRESSION: No cholelithiasis or sonographic evidence for acute cholecystitis.     Assessment/plan: Problem List Items Addressed This Visit   None Visit Diagnoses       Chronic viral hepatitis B without delta agent and without coma (HCC)    -  Primary   Relevant Orders   Hepatitis delta antibody   CBC   COMPLETE METABOLIC PANEL WITH GFR   Hepatitis B surface antibody,quantitative   Hepatitis B Surface AntiGEN   Hepatitis B Core Antibody, total   Hepatitis B DNA, ultraquantitative, PCR   Hepatitis B e antigen   Hepatitis B e antibody        68 yo male [place] born patient but immigrated to the [place], referred for hep b management.  As of initial visit with me 10/12/23 wasn't cleared what the acuity of the infection was: 1) Discussed with patient this could be chronic hep b with acute flare (given family hx likely with hep b and wife also has chronic hep b) 2) This could also be acute infection   Sister with hep b/c cirrhosis s/p liver transplant doing well Patient doing well at this time I will closely monitor how he does for the next  6 months. If acute hep b hopefully he 'll achieve functional cure.   If functional cure achieved, will discuss titrating off tenofovir (keeping in mind family hx cirrhosis)   If no functional cure will likely keep on tenofovir.   Will also discuss plan to monitor for liver cancer in the future depending on what the hep b status is    No labs today at request as he feels welll   F/u in 6 weeks  Continue tenofovir  --------------------- 11/18/23 id clinic f/u Doing well on tenofovir No sx of n/v/diarrhea/jaundice-itch Will repeat labs today -- will consider fibrotest or elastography once the lft is much improved  Again if functional cure is achieved, will assume it's acute hep b infection and potentially get him off tenofovir and monitor   Labs today   F/u  3 months for repeat labs      Follow-up: No follow-ups on file.  Raymondo Band, MD [facility] 11/18/2023, 3:47 PM

## 2023-11-18 NOTE — Patient Instructions (Signed)
 Labs today   Continue your tenofovir   In another 6 months if your labs show that you are cured, will stop tenofovir   See me in 3 months

## 2023-11-24 LAB — HEPATITIS B E ANTIBODY: Hep B E Ab: REACTIVE — AB

## 2023-11-24 LAB — CBC
HCT: 39.4 % (ref 38.5–50.0)
Hemoglobin: 12.5 g/dL — ABNORMAL LOW (ref 13.2–17.1)
MCH: 28.9 pg (ref 27.0–33.0)
MCHC: 31.7 g/dL — ABNORMAL LOW (ref 32.0–36.0)
MCV: 91 fL (ref 80.0–100.0)
MPV: 13.1 fL — ABNORMAL HIGH (ref 7.5–12.5)
Platelets: 225 10*3/uL (ref 140–400)
RBC: 4.33 10*6/uL (ref 4.20–5.80)
RDW: 14 % (ref 11.0–15.0)
WBC: 4.5 10*3/uL (ref 3.8–10.8)

## 2023-11-24 LAB — COMPLETE METABOLIC PANEL WITH GFR
AG Ratio: 1.9 (calc) (ref 1.0–2.5)
ALT: 40 U/L (ref 9–46)
AST: 29 U/L (ref 10–35)
Albumin: 4.5 g/dL (ref 3.6–5.1)
Alkaline phosphatase (APISO): 96 U/L (ref 35–144)
BUN/Creatinine Ratio: 28 (calc) — ABNORMAL HIGH (ref 6–22)
BUN: 15 mg/dL (ref 7–25)
CO2: 28 mmol/L (ref 20–32)
Calcium: 9.8 mg/dL (ref 8.6–10.3)
Chloride: 101 mmol/L (ref 98–110)
Creat: 0.53 mg/dL — ABNORMAL LOW (ref 0.70–1.35)
Globulin: 2.4 g/dL (ref 1.9–3.7)
Glucose, Bld: 119 mg/dL — ABNORMAL HIGH (ref 65–99)
Potassium: 4.4 mmol/L (ref 3.5–5.3)
Sodium: 138 mmol/L (ref 135–146)
Total Bilirubin: 1.1 mg/dL (ref 0.2–1.2)
Total Protein: 6.9 g/dL (ref 6.1–8.1)

## 2023-11-24 LAB — HEPATITIS B SURFACE ANTIBODY, QUANTITATIVE: Hep B S AB Quant (Post): <5 m[IU]/mL — ABNORMAL LOW (ref >=10)

## 2023-11-24 LAB — HEPATITIS B CORE ANTIBODY, TOTAL: Hep B Core Total Ab: REACTIVE — AB

## 2023-11-24 LAB — HEPATITIS B E ANTIGEN: Hep B E Ag: NONREACTIVE

## 2023-11-24 LAB — HEPATITIS DELTA ANTIBODY: Hepatitis D Ab, Total: NEGATIVE

## 2023-11-24 LAB — HEPATITIS B DNA, ULTRAQUANTITATIVE, PCR
Hepatitis B DNA: 123 [IU]/mL — ABNORMAL HIGH
Hepatitis B virus DNA: 2.09 {Log_IU}/mL — ABNORMAL HIGH

## 2023-11-24 LAB — HEPATITIS B SURFACE ANTIGEN: Hepatitis B Surface Ag: REACTIVE — AB

## 2024-01-10 ENCOUNTER — Ambulatory Visit (INDEPENDENT_AMBULATORY_CARE_PROVIDER_SITE_OTHER): Admitting: Emergency Medicine

## 2024-01-10 ENCOUNTER — Encounter: Payer: Self-pay | Admitting: Emergency Medicine

## 2024-01-10 ENCOUNTER — Ambulatory Visit: Admitting: Emergency Medicine

## 2024-01-10 VITALS — BP 124/78 | HR 67 | Temp 98.1°F | Ht 66.0 in | Wt 178.0 lb

## 2024-01-10 DIAGNOSIS — E1159 Type 2 diabetes mellitus with other circulatory complications: Secondary | ICD-10-CM | POA: Diagnosis not present

## 2024-01-10 DIAGNOSIS — Z13820 Encounter for screening for osteoporosis: Secondary | ICD-10-CM

## 2024-01-10 DIAGNOSIS — I152 Hypertension secondary to endocrine disorders: Secondary | ICD-10-CM | POA: Diagnosis not present

## 2024-01-10 NOTE — Patient Instructions (Signed)
 Mantenimiento de la salud despus de los 65 aos de edad Health Maintenance After Age 67 Despus de los 65 aos de edad, corre un riesgo mayor de Film/video editor enfermedades e infecciones a Air cabin crew, como tambin de sufrir lesiones por cadas. Las cadas son la causa principal de las fracturas de huesos y lesiones en la cabeza de personas mayores de 65 aos de edad. Recibir cuidados preventivos de forma regular puede ayudarlo a mantenerse saludable y en buen [place]. Los cuidados preventivos incluyen realizarse anlisis de forma regular y Forensic psychologist en el estilo de vida segn las recomendaciones del mdico. Converse con el mdico sobre lo siguiente: Las pruebas de deteccin y los anlisis que debe International aid/development worker. Una prueba de deteccin es un estudio que se para Engineer, manufacturing la presencia de una enfermedad cuando no tiene sntomas. Un plan de dieta y ejercicios adecuado para usted. Qu debo saber sobre las pruebas de deteccin y los anlisis para prevenir cadas? Realizarse pruebas de deteccin y [employer] es la mejor manera de Engineer, manufacturing un problema de salud de forma temprana. El diagnstico y tratamiento tempranos le brindan la mejor oportunidad de Chief Operating Officer las afecciones mdicas que son comunes despus de los 65 aos de edad. Ciertas afecciones y elecciones de estilo de vida pueden hacer que sea ms propenso a sufrir Engineer, site. El mdico puede recomendarle lo siguiente: Controles regulares de la visin. Una visin deficiente y afecciones como las cataratas pueden hacer que sea ms propenso a sufrir Engineer, site. Si [place] lentes, asegrese de obtener una receta actualizada si su visin cambia. Revisin de medicamentos. Revise regularmente con el mdico todos los medicamentos que toma, incluidos los medicamentos de [place]. Consulte al [employer] efectos secundarios que pueden hacer que sea ms propenso a sufrir Engineer, site. Informe al mdico si alguno de los medicamentos que toma lo hace sentir mareado o  somnoliento. Controles de fuerza y equilibrio. El mdico puede recomendar ciertos estudios para controlar su fuerza y equilibrio al estar de pie, al caminar o al cambiar de posicin. Examen de los pies. El dolor y Materials engineer en los pies, como tambin no utilizar el calzado [place], pueden hacer que sea ms propenso a sufrir Engineer, site. Pruebas de deteccin, que incluyen las siguientes: Pruebas de deteccin para la osteoporosis. La osteoporosis es una afeccin que hace que los huesos se tornen ms dbiles y se quiebren con ms facilidad. Pruebas de deteccin para la presin arterial. Los cambios en la presin arterial y los medicamentos para Chief Operating Officer la presin arterial pueden hacerlo sentir mareado. Prueba de deteccin de la depresin. Es ms probable que sufra una cada si tiene miedo a caerse, se siente deprimido o se siente incapaz de Probation officer. Prueba de deteccin de consumo de alcohol. Beber demasiado alcohol puede afectar su equilibrio y puede hacer que sea ms propenso a sufrir Engineer, site. Siga estas indicaciones en su casa: Estilo de vida No beba alcohol si: Su mdico le indica no hacerlo. Si bebe alcohol: Limite la cantidad que bebe a lo siguiente: De 0 a 1 medida por da para las mujeres. De 0 a 2 medidas por da para los hombres. Sepa cunta cantidad de alcohol hay en las bebidas que toma. En los [address], una medida equivale a una botella de cerveza de 12 oz (355 ml), un vaso de vino de 5 oz (148 ml) o un vaso de una bebida alcohlica de alta graduacin de 1 oz (44 ml). No consuma ningn producto que  contenga nicotina o tabaco. Estos productos incluyen cigarrillos, tabaco para Theatre manager y aparatos de vapeo, como los Administrator, Civil Service. Si necesita ayuda para dejar de consumir estos productos, consulte al [employer]. Actividad  Siga un programa de ejercicio regular para mantenerse en forma. Esto lo ayudar a Radio producer equilibrio. Consulte al  mdico qu tipos de ejercicios son adecuados para usted. Si necesita un bastn o un andador, selo segn las recomendaciones del mdico. Utilice calzado con buen apoyo y suela antideslizante. Seguridad  Retire los [employer] puedan causar tropiezos tales como alfombras, cables u obstculos. Instale equipos de seguridad, como barras para sostn en los baos y barandas de seguridad en las escaleras. Mantenga las habitaciones y los pasillos bien iluminados. Indicaciones generales Hable con el mdico sobre sus riesgos de sufrir una cada. Infrmele a su mdico si: Se cae. Asegrese de informarle a su mdico acerca de todas las cadas, incluso aquellas que parecen ser [employer]. Se siente mareado, cansado (tiene fatiga) o siente que pierde el equilibrio. Use los medicamentos de venta libre y los recetados solamente como se lo haya indicado el mdico. Estos incluyen suplementos. Siga una dieta sana y [place] un peso saludable. Una dieta saludable incluye productos lcteos descremados, carnes bajas en contenido de grasa ([place]), fibra de granos enteros, frijoles y [place] frutas y verduras. Mantngase al da con las vacunas. Realcese los estudios de rutina de la salud, dentales y de Wellsite geologist. Resumen Tener un estilo de vida saludable y recibir cuidados preventivos pueden ayudar a Research scientist (physical sciences) salud y el bienestar despus de los 65 aos de [place]. Realizarse pruebas de deteccin y [employer] es la mejor manera de Engineer, manufacturing un problema de salud de forma temprana y Eber [place] a Automotive engineer una cada. El diagnstico y tratamiento tempranos le brindan la mejor oportunidad de Chief Operating Officer las afecciones mdicas ms comunes en las personas mayores de 65 aos de edad. Las cadas son la causa principal de las fracturas de huesos y lesiones en la cabeza de personas mayores de 65 aos de edad. Tome precauciones para evitar una cada en su casa. Trabaje con el mdico para saber qu cambios que puede hacer para mejorar su salud y  [place], y para prevenir las cadas. Esta informacin no tiene Theme park manager el consejo del mdico. Asegrese de hacerle al mdico cualquier pregunta que tenga. Document Revised: 01/22/2021 Document Reviewed: 01/22/2021 Elsevier Patient Education  2024 [employer].

## 2024-01-10 NOTE — Progress Notes (Signed)
 Erik Mcgee 68 y.o.   Chief Complaint  Patient presents with   Follow-up    Patient here for f/u with infectious disease. And has some questions with bone density.     HISTORY OF PRESENT ILLNESS: This is a 68 y.o. male here for follow-up of chronic medical conditions Earlier this year developed acute hepatitis B.  Was able to follow-up with ID doctor.  Was started on antiviral medication.  Still taking it.  Doing well.  No complications. Most recent liver ultrasound within normal limits. Recently had dental implant but had it removed due to bone weakness.  Follow-up with surgeon raised possibility of osteoporosis.  Requesting bone density scan  HPI   Prior to Admission medications   Medication Sig Start Date End Date Taking? Authorizing Provider  amLODipine  (NORVASC ) 5 MG tablet Take 1 tablet by mouth once daily 12/21/22  Yes Dayanira Giovannetti Jose, MD  tenofovir (VIREAD) 300 MG tablet Take 300 mg by mouth daily.   Yes [provider]  tadalafil  (ADCIRCA /CIALIS ) 20 MG tablet Take 0.5-1 tablets (10-20 mg total) by mouth every other day as needed for erectile dysfunction. Patient not taking: Reported on 10/12/2023 07/05/18   Elvira Hammersmith, MD    No Known Allergies  Patient Active Problem List   Diagnosis Date Noted   Statin intolerance 03/19/2020   Erectile dysfunction 07/05/2018   History of gastroesophageal reflux (GERD) 09/10/2017   Hypertension associated with diabetes (HCC) 07/28/2017    Past Medical History:  Diagnosis Date   Hypertension     History reviewed. No pertinent surgical history.  Social History   Socioeconomic History   Marital status: Married    Spouse name: Not on file   Number of children: Not on file   Years of education: Not on file   Highest education level: Not on file  Occupational History   Not on file  Tobacco Use   Smoking status: Never   Smokeless tobacco: Never  Substance and Sexual Activity   Alcohol use: No   Drug  use: No   Sexual activity: Not on file  Other Topics Concern   Not on file  Social History Narrative   Not on file   Social Drivers of Health   Financial Resource Strain: Low Risk  (08/09/2023)   Received from [facility]   Overall Financial Resource Strain (CARDIA)    Difficulty of Paying Living Expenses: Not hard at all  Food Insecurity: No Food Insecurity (08/09/2023)   Received from [facility]   Hunger Vital Sign    Worried About Running Out of Food in the Last Year: Never true    Ran Out of Food in the Last Year: Never true  Transportation Needs: No Transportation Needs (08/09/2023)   Received from [facility]    Lack of Transportation (Medical): No    Lack of Transportation (Non-Medical): No  Physical Activity: Not on file  Stress: Not on file  Social Connections: Unknown (01/09/2022)   Received from [facility], [facility]   Social Network    Social Network: Not on file  Intimate Partner Violence: Unknown (12/01/2021)   Received from [facility], [facility]   HITS    Physically Hurt: Not on file    Insult or Talk Down To: Not on file    Threaten Physical Harm: Not on file    Scream or Curse: Not on file    Family History  Problem Relation Age of Onset  Cancer Mother        melamona   Heart disease Father      Review of Systems  Constitutional: Negative.  Negative for chills and fever.  HENT: Negative.  Negative for congestion and sore throat.   Respiratory: Negative.  Negative for cough and shortness of breath.   Cardiovascular: Negative.  Negative for chest pain and palpitations.  Gastrointestinal:  Negative for abdominal pain, diarrhea, nausea and vomiting.  Genitourinary: Negative.  Negative for dysuria and hematuria.  Skin: Negative.  Negative for rash.  Neurological: Negative.  Negative for dizziness and headaches.  All other systems reviewed and are negative.   Vitals:   01/10/24 1508  BP: 124/78  Pulse:  67  Temp: 98.1 F (36.7 C)  SpO2: 98%    Physical Exam Vitals reviewed.  Constitutional:      Appearance: Normal appearance.  HENT:     Head: Normocephalic.     Mouth/Throat:     Mouth: Mucous membranes are moist.     Pharynx: Oropharynx is clear.  Eyes:     Extraocular Movements: Extraocular movements intact.     Conjunctiva/sclera: Conjunctivae normal.     Pupils: Pupils are equal, round, and reactive to light.  Cardiovascular:     Rate and Rhythm: Normal rate and regular rhythm.     Pulses: Normal pulses.     Heart sounds: Normal heart sounds.  Pulmonary:     Effort: Pulmonary effort is normal.     Breath sounds: Normal breath sounds.  Abdominal:     Palpations: Abdomen is soft.     Tenderness: There is no abdominal tenderness.  Musculoskeletal:     Cervical back: No tenderness.  Lymphadenopathy:     Cervical: No cervical adenopathy.  Skin:    General: Skin is warm and dry.     Capillary Refill: Capillary refill takes less than 2 seconds.  Neurological:     General: No focal deficit present.     Mental Status: He is alert and oriented to person, place, and time.  Psychiatric:        Mood and Affect: Mood normal.        Behavior: Behavior normal.      ASSESSMENT & PLAN: A total of 40 minutes was spent with the patient and counseling/coordination of care regarding preparing for this visit, review of most recent office visit notes, review of multiple chronic medical conditions and their management, review of all medications, review of most recent bloodwork results, review of health maintenance items, education on nutrition, prognosis, documentation, and need for follow up.   Problem List Items Addressed This Visit       Cardiovascular and Mediastinum   Hypertension associated with diabetes (HCC) - Primary   BP Readings from Last 3 Encounters:  01/10/24 124/78  11/18/23 137/78  10/12/23 129/81  Well-controlled hypertension Continue Amlodipine  5 mg daily Not  on diabetic medication Lab Results  Component Value Date   HGBA1C 5.9 10/05/2023  Diet and nutrition discussed Cardiovascular risk associated with hypertension and diabetes discussed        Other Visit Diagnoses       Osteoporosis screening       Relevant Orders   DG Bone Density      Patient Instructions  Mantenimiento de la salud despus de los 65 aos de edad Health Maintenance After Age 87 Despus de los 65 aos de edad, corre un riesgo mayor de Film/video editor enfermedades e infecciones a largo plazo, como tambin  de sufrir lesiones por cadas. Las cadas son la causa principal de las fracturas de huesos y lesiones en la cabeza de personas mayores de 65 aos de edad. Recibir cuidados preventivos de forma regular puede ayudarlo a mantenerse saludable y en buen [place]. Los cuidados preventivos incluyen realizarse anlisis de forma regular y Forensic psychologist en el estilo de vida segn las recomendaciones del mdico. Converse con el mdico sobre lo siguiente: Las pruebas de deteccin y los anlisis que debe International aid/development worker. Una prueba de deteccin es un estudio que se para Engineer, manufacturing la presencia de una enfermedad cuando no tiene sntomas. Un plan de dieta y ejercicios adecuado para usted. Qu debo saber sobre las pruebas de deteccin y los anlisis para prevenir cadas? Realizarse pruebas de deteccin y [employer] es la mejor manera de Engineer, manufacturing un problema de salud de forma temprana. El diagnstico y tratamiento tempranos le brindan la mejor oportunidad de Chief Operating Officer las afecciones mdicas que son comunes despus de los 65 aos de edad. Ciertas afecciones y elecciones de estilo de vida pueden hacer que sea ms propenso a sufrir Engineer, site. El mdico puede recomendarle lo siguiente: Controles regulares de la visin. Una visin deficiente y afecciones como las cataratas pueden hacer que sea ms propenso a sufrir Engineer, site. Si usa  lentes, asegrese de obtener una receta actualizada si su visin  cambia. Revisin de medicamentos. Revise regularmente con el mdico todos los medicamentos que toma, incluidos los medicamentos de [place]. Consulte al [employer] Products efectos secundarios que pueden hacer que sea ms propenso a sufrir Engineer, site. Informe al mdico si alguno de los medicamentos que toma lo hace sentir mareado o somnoliento. Controles de fuerza y equilibrio. El mdico puede recomendar ciertos estudios para controlar su fuerza y equilibrio al estar de pie, al caminar o al cambiar de posicin. Examen de los pies. El dolor y Materials engineer en los pies, como tambin no utilizar el calzado adecuado, pueden hacer que sea ms propenso a sufrir Engineer, site. Pruebas de deteccin, que incluyen las siguientes: Pruebas de deteccin para la osteoporosis. La osteoporosis es una afeccin que hace que los huesos se tornen ms dbiles y se quiebren con ms facilidad. Pruebas de deteccin para la presin arterial. Los cambios en la presin arterial y los medicamentos para Chief Operating Officer la presin arterial pueden hacerlo sentir mareado. Prueba de deteccin de la depresin. Es ms probable que sufra una cada si tiene miedo a caerse, se siente deprimido o se siente incapaz de Probation officer. Prueba de deteccin de consumo de alcohol. Beber demasiado alcohol puede afectar su equilibrio y puede hacer que sea ms propenso a sufrir Engineer, site. Siga estas indicaciones en su casa: Estilo de vida No beba alcohol si: Su mdico le indica no hacerlo. Si bebe alcohol: Limite la cantidad que bebe a lo siguiente: De 0 a 1 medida por da para las mujeres. De 0 a 2 medidas por da para los hombres. Sepa cunta cantidad de alcohol hay en las bebidas que toma. En los [address], una medida equivale a una botella de cerveza de 12 oz (355 ml), un vaso de vino de 5 oz (148 ml) o un vaso de una bebida alcohlica de alta graduacin de 1 oz (44 ml). No consuma ningn producto que contenga nicotina o  tabaco. Estos productos incluyen cigarrillos, tabaco para Theatre manager y aparatos de vapeo, como los cigarrillos electrnicos. Si necesita ayuda para dejar de consumir estos productos, consulte al [employer]. Actividad  Siga un programa de  ejercicio regular para mantenerse en forma. Esto lo ayudar a Radio producer equilibrio. Consulte al mdico qu tipos de ejercicios son adecuados para usted. Si necesita un bastn o un andador, selo segn las recomendaciones del mdico. Utilice calzado con buen apoyo y suela antideslizante. Seguridad  Retire los [employer] puedan causar tropiezos tales como alfombras, cables u obstculos. Instale equipos de seguridad, como barras para sostn en los baos y barandas de seguridad en las escaleras. Mantenga las habitaciones y los pasillos bien iluminados. Indicaciones generales Hable con el mdico sobre sus riesgos de sufrir una cada. Infrmele a su mdico si: Se cae. Asegrese de informarle a su mdico acerca de todas las cadas, incluso aquellas que parecen ser [employer]. Se siente mareado, cansado (tiene fatiga) o siente que pierde el equilibrio. Use los medicamentos de venta libre y los recetados solamente como se lo haya indicado el mdico. Estos incluyen suplementos. Siga una dieta sana y [place] un peso saludable. Una dieta saludable incluye productos lcteos descremados, carnes bajas en contenido de grasa ([place]), fibra de granos enteros, frijoles y [place] frutas y verduras. Mantngase al da con las vacunas. Realcese los estudios de rutina de la salud, dentales y de Wellsite geologist. Resumen Tener un estilo de vida saludable y recibir cuidados preventivos pueden ayudar a Research scientist (physical sciences) salud y el bienestar despus de los 65 aos de [place]. Realizarse pruebas de deteccin y anlisis es la mejor manera de Engineer, manufacturing un problema de salud de forma temprana y ayudarlo a Automotive engineer una cada. El diagnstico y tratamiento tempranos le brindan la mejor oportunidad de Chief Operating Officer las  afecciones mdicas ms comunes en las personas mayores de 65 aos de edad. Las cadas son la causa principal de las fracturas de huesos y lesiones en la cabeza de personas mayores de 65 aos de edad. Tome precauciones para evitar una cada en su casa. Trabaje con el mdico para saber qu cambios que puede hacer para mejorar su salud y [place], y para prevenir las cadas. Esta informacin no tiene Theme park manager el consejo del mdico. Asegrese de hacerle al mdico cualquier pregunta que tenga. Document Revised: 01/22/2021 Document Reviewed: 01/22/2021 Elsevier Patient Education  2024 Elsevier Inc.     Maryagnes Small, MD [facility]

## 2024-01-10 NOTE — Assessment & Plan Note (Signed)
 BP Readings from Last 3 Encounters:  01/10/24 124/78  11/18/23 137/78  10/12/23 129/81  Well-controlled hypertension Continue Amlodipine  5 mg daily Not on diabetic medication Lab Results  Component Value Date   HGBA1C 5.9 10/05/2023  Diet and nutrition discussed Cardiovascular risk associated with hypertension and diabetes discussed

## 2024-01-13 ENCOUNTER — Ambulatory Visit
Admission: RE | Admit: 2024-01-13 | Discharge: 2024-01-13 | Discharge status: Home / Self Care | Source: Ambulatory Visit | Attending: Emergency Medicine

## 2024-01-13 DIAGNOSIS — Z13820 Encounter for screening for osteoporosis: Secondary | ICD-10-CM | POA: Diagnosis not present

## 2024-01-17 ENCOUNTER — Ambulatory Visit: Payer: Self-pay | Admitting: Emergency Medicine

## 2024-02-15 ENCOUNTER — Ambulatory Visit: Admitting: Internal Medicine

## 2024-02-15 ENCOUNTER — Other Ambulatory Visit: Payer: Self-pay

## 2024-02-15 ENCOUNTER — Encounter: Payer: Self-pay | Admitting: Internal Medicine

## 2024-02-15 VITALS — BP 130/80 | HR 84 | Resp 16 | Ht 66.0 in | Wt 178.0 lb

## 2024-02-15 DIAGNOSIS — B181 Chronic viral hepatitis B without delta-agent: Secondary | ICD-10-CM | POA: Diagnosis not present

## 2024-02-15 DIAGNOSIS — K7400 Hepatic fibrosis, unspecified: Secondary | ICD-10-CM | POA: Diagnosis not present

## 2024-02-15 NOTE — Patient Instructions (Signed)
 Labs today   We'll also order a special liver imaging to see if you have advance inflammation/cirrhosis   See us  in 3 months to review labs/imaging result and see if we can stop the antiviral medication

## 2024-02-15 NOTE — Progress Notes (Signed)
 [facility]  Reason for Consult:hepatitis b infection Referring Provider: Vedia Geralds, miguel (pcp)    Patient Active Problem List   Diagnosis Date Noted   Statin intolerance 03/19/2020   Erectile dysfunction 07/05/2018   History of gastroesophageal reflux (GERD) 09/10/2017   Hypertension associated with diabetes (HCC) 07/28/2017      HPI: Erik Mcgee is a 68 y.o. male referred here by pcp for acute hepatitis b infection; he is here for periodic follow up chronic hep b   [place] and came back 10/02/23. He visited his wife there.  He was feeling bloating/discomfort upper quadrant and subjective fever for the week before going to [place] actually. 2-3 months prior to that he had pruritic rash but nothing visible. He developed scleral icterus and more fever, and increased abd pain when he was in [place]. He went to see a doctor there and was diagnosed with hepatitis b  No n/v/d/malaise  He was started on tenofovir there  He saw his pcp again here on 10/05/23 and hepatitis b labs showed positive sAg, cIGM. 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 He denies having extramarital sex, recent tatoos/blood transfusion. He last saw pcp before this 08/2022 to do physical  Some weight loss 10 pounds the last few weeks, but stable now   Right now no sx   His father has some kind of hepatitis not sure what kind. His mother also has it. He doesn't know if he has hep b in the past. Never donated blood before His dad died of heart attack and his mom died of liver disease No one in family has cirrhosis or liver issue  His sister has hep b and c and cirrhosis and had a transplant  He doesn't drink etoh   He was born in [place]; [place]   He'll be going  back to [place] 02/2024 for dental work.   His wife has hep b but not requiring treatment   No bleeding/bruising  ------------------- 02/15/24 id clinic f/u No complaint Taking entecavir without missed dose last 4 weeks See a&P for detail     Review of Systems: ROS All other ros negative      Past Medical History:  Diagnosis Date   Hypertension     Social History   Tobacco Use   Smoking status: Never   Smokeless tobacco: Never  Substance Use Topics   Alcohol use: No   Drug use: No    Family History  Problem Relation Age of Onset   Cancer Mother        melamona   Heart disease Father     No Known Allergies  OBJECTIVE: Vitals:   02/15/24 1606  BP: 130/80  Pulse: 84  Resp: 16  Weight: 178 lb (80.7 kg)  Height: 5' 6 (1.676 m)     Body mass index is 28.73 kg/m.   Physical Exam General/constitutional: no distress, pleasant HEENT: Normocephalic, PER, Conj Clear, EOMI, Oropharynx clear Neck supple CV: rrr no mrg Lungs: clear to auscultation, normal respiratory effort Abd: Soft, Nontender Ext: no edema Skin: No Rash Neuro: nonfocal MSK: no peripheral joint swelling/tenderness/warmth; back spines nontender    Lab: Lab Results  Component Value Date   WBC  4.5 11/18/2023   HGB 12.5 (L) 11/18/2023   HCT 39.4 11/18/2023   MCV 91.0 11/18/2023   PLT 225 11/18/2023   Last metabolic panel Lab Results  Component Value Date   GLUCOSE 119 (H) 11/18/2023   NA 138 11/18/2023   K 4.4 11/18/2023   CL 101 11/18/2023   CO2 28 11/18/2023   BUN 15 11/18/2023   CREATININE 0.53 (L) 11/18/2023   GFR 98.30 10/05/2023   CALCIUM  9.8 11/18/2023   PROT 6.9 11/18/2023   ALBUMIN 4.1 10/05/2023   LABGLOB 2.3 09/30/2020   AGRATIO 2.0 09/30/2020   BILITOT 1.1 11/18/2023   ALKPHOS 151 (H) 10/05/2023   AST 29 11/18/2023   ALT 40 11/18/2023    Microbiology:  Serology:  Imaging: Reviewed  10/07/23 liver u/s IMPRESSION: No cholelithiasis or sonographic  evidence for acute cholecystitis.     Assessment/plan: Problem List Items Addressed This Visit   None Visit Diagnoses       Chronic viral hepatitis B without delta agent and without coma (HCC)    -  Primary   Relevant Orders   CBC w/Diff   COMPLETE METABOLIC PANEL WITHOUT GFR   Hepatitis B surface antibody,quantitative   Hepatitis B Surface AntiGEN   Hepatitis B DNA, ultraquantitative, PCR   Hepatitis B Core Antibody, total   Hepatitis B e antigen   US  ABDOMEN COMPLETE W/ELASTOGRAPHY         68 yo male [place] born patient [place] to the [place] , referred for hep b management.  As of initial visit with me 10/12/23 wasn't cleared what the acuity of the infection was: 1) Discussed with patient this could be chronic hep b with acute flare (given family hx likely with hep b and wife also has chronic hep b) 2) This could also be acute infection   Sister with hep b/c cirrhosis s/p liver transplant doing well Patient doing well at this time I will closely monitor how he does for the next  6 months. If acute hep b hopefully he 'll achieve functional cure.   If functional cure achieved, will discuss titrating off tenofovir (keeping in mind family hx cirrhosis)   If no functional cure will likely keep on tenofovir.   Will also discuss plan to monitor for liver cancer in the future depending on what the hep b status is    No labs today at request as he feels welll   F/u in 6 weeks  Continue tenofovir  --------------------- 11/18/23 id clinic f/u Doing well on tenofovir No sx of n/v/diarrhea/jaundice-itch Will repeat labs today -- will consider fibrotest or elastography once the lft is much improved  Again if functional cure is achieved, will assume it's acute hep b infection and potentially get him off tenofovir and monitor   Labs today   F/u 3 months for repeat labs   ------------- 02/15/24 id clinic assessment Doing well Tolerating entecavir 10/2023 labs  normalized lft  Again unclear if recent acute hep b or chronic hep b that had flared recently   -elastography ordered (this if suggestive advance fibrosis, will keep on entecavir even if he functionally cure his hepatitis b) -continue entecavir for now -labs today -f/u 3 months to discuss labs/elastography    Follow-up: Return in about 3 months (around 05/17/2024).  Jamesetta Mcbride, MD [facility] 02/15/2024, 4:04 PM

## 2024-02-18 LAB — CBC WITH DIFFERENTIAL/PLATELET
Absolute Lymphocytes: 2091 {cells}/uL (ref 850–3900)
Absolute Monocytes: 530 {cells}/uL (ref 200–950)
Basophils Absolute: 20 {cells}/uL (ref 0–200)
Basophils Relative: 0.4 %
Eosinophils Absolute: 71 {cells}/uL (ref 15–500)
Eosinophils Relative: 1.4 %
HCT: 40.5 % (ref 38.5–50.0)
Hemoglobin: 13.3 g/dL (ref 13.2–17.1)
MCH: 28.2 pg (ref 27.0–33.0)
MCHC: 32.8 g/dL (ref 32.0–36.0)
MCV: 85.8 fL (ref 80.0–100.0)
MPV: 12.6 fL — ABNORMAL HIGH (ref 7.5–12.5)
Monocytes Relative: 10.4 %
Neutro Abs: 2387 {cells}/uL (ref 1500–7800)
Neutrophils Relative %: 46.8 %
Platelets: 197 10*3/uL (ref 140–400)
RBC: 4.72 10*6/uL (ref 4.20–5.80)
RDW: 12.5 % (ref 11.0–15.0)
Total Lymphocyte: 41 %
WBC: 5.1 10*3/uL (ref 3.8–10.8)

## 2024-02-18 LAB — COMPLETE METABOLIC PANEL WITHOUT GFR
AG Ratio: 2 (calc) (ref 1.0–2.5)
ALT: 26 U/L (ref 9–46)
AST: 23 U/L (ref 10–35)
Albumin: 4.4 g/dL (ref 3.6–5.1)
Alkaline phosphatase (APISO): 74 U/L (ref 35–144)
BUN: 17 mg/dL (ref 7–25)
CO2: 27 mmol/L (ref 20–32)
Calcium: 9.1 mg/dL (ref 8.6–10.3)
Chloride: 104 mmol/L (ref 98–110)
Creat: 0.87 mg/dL (ref 0.70–1.35)
Globulin: 2.2 g/dL (ref 1.9–3.7)
Glucose, Bld: 119 mg/dL — ABNORMAL HIGH (ref 65–99)
Potassium: 4.4 mmol/L (ref 3.5–5.3)
Sodium: 138 mmol/L (ref 135–146)
Total Bilirubin: 0.5 mg/dL (ref 0.2–1.2)
Total Protein: 6.6 g/dL (ref 6.1–8.1)

## 2024-02-18 LAB — HEPATITIS B DNA, ULTRAQUANTITATIVE, PCR
Hepatitis B DNA: 14 [IU]/mL — ABNORMAL HIGH
Hepatitis B virus DNA: 1.15 {Log_IU}/mL — ABNORMAL HIGH

## 2024-02-18 LAB — HEPATITIS B SURFACE ANTIGEN
Confirmation: REACTIVE — AB
Hepatitis B Surface Ag: REACTIVE — AB

## 2024-02-18 LAB — HEPATITIS B CORE ANTIBODY, TOTAL: Hep B Core Total Ab: REACTIVE — AB

## 2024-02-18 LAB — HEPATITIS B E ANTIGEN: Hep B E Ag: NONREACTIVE

## 2024-02-18 LAB — HEPATITIS B SURFACE ANTIBODY, QUANTITATIVE: Hep B S AB Quant (Post): <5 m[IU]/mL — ABNORMAL LOW (ref >=10)

## 2024-02-23 ENCOUNTER — Ambulatory Visit (HOSPITAL_COMMUNITY)
Admission: RE | Admit: 2024-02-23 | Discharge: 2024-02-23 | Disposition: A | Discharge status: Home / Self Care | Source: Ambulatory Visit | Attending: Internal Medicine | Admitting: Internal Medicine

## 2024-02-23 DIAGNOSIS — B181 Chronic viral hepatitis B without delta-agent: Secondary | ICD-10-CM | POA: Diagnosis not present

## 2024-02-24 ENCOUNTER — Ambulatory Visit: Payer: Self-pay | Admitting: Internal Medicine

## 2024-03-18 ENCOUNTER — Other Ambulatory Visit: Payer: Self-pay | Admitting: Emergency Medicine

## 2024-03-18 DIAGNOSIS — I10 Essential (primary) hypertension: Secondary | ICD-10-CM

## 2024-03-18 DIAGNOSIS — I152 Hypertension secondary to endocrine disorders: Secondary | ICD-10-CM

## 2024-04-13 ENCOUNTER — Ambulatory Visit

## 2024-05-16 ENCOUNTER — Encounter: Payer: Self-pay | Admitting: Internal Medicine

## 2024-05-16 ENCOUNTER — Other Ambulatory Visit: Payer: Self-pay

## 2024-05-16 ENCOUNTER — Ambulatory Visit: Admitting: Internal Medicine

## 2024-05-16 VITALS — BP 164/89 | HR 79 | Temp 98.1°F | Ht 70.0 in | Wt 180.0 lb

## 2024-05-16 DIAGNOSIS — B181 Chronic viral hepatitis B without delta-agent: Secondary | ICD-10-CM

## 2024-05-16 DIAGNOSIS — K7402 Hepatic fibrosis, advanced fibrosis: Secondary | ICD-10-CM

## 2024-05-16 NOTE — Patient Instructions (Signed)
 Your special imaging suprisingly shows advance fibrosis   That means we'll need to keep you on medication indefinitely  See me in 6 months for labs/ultrasound   Please make sure you have 3 times a week to spend exercise as this will improve liver health as well

## 2024-05-16 NOTE — Progress Notes (Signed)
 [facility]  Reason for Consult:hepatitis b infection Referring Provider: [name], miguel (pcp)    Patient Active Problem List   Diagnosis Date Noted   Statin intolerance 03/19/2020   Erectile dysfunction 07/05/2018   History of gastroesophageal reflux (GERD) 09/10/2017   Hypertension associated with diabetes (HCC) 07/28/2017      HPI: Erik Mcgee is a 68 y.o. male referred here by pcp for acute hepatitis b infection; he is here for periodic follow up chronic hep b   Patient went to [place] 09[place]16[place]23 and came back 10/02/23. He visited his wife there.  He was feeling bloating/discomfort upper quadrant and subjective fever for the week before going to [place] actually. 2-3 months prior to that he had pruritic rash but nothing visible. He developed scleral icterus and more fever, and increased abd pain when he was in [place]. He went to see a doctor there and was diagnosed with hepatitis b  No n/v/d/malaise  He was started on tenofovir there  He saw his pcp again here on 10/05/23 and hepatitis b labs showed positive sAg, cIGM. 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 He denies having extramarital sex, recent tatoos/blood transfusion. He last saw pcp before this 08/2022 to do physical  Some weight loss 10 pounds the last few weeks, but stable now   Right now no sx   His father has some kind of hepatitis not sure what kind. His mother also has it. He doesn't know if he has hep b in the past. Never donated blood before His dad died of heart attack and his mom died of liver disease No one in family has cirrhosis or liver issue  His sister has hep b and c and cirrhosis and had a transplant  He doesn't drink etoh   He was born in [place]; [place]   He'll be going  back to [place] 02/2024 for dental work.   His wife has hep b but not requiring treatment   No bleeding/bruising  ------------------- 05/16/24 id clinic f/u No complaint Taking entecavir without missed dose last 4 weeks See a&P for detail     Review of Systems: ROS All other ros negative      Past Medical History:  Diagnosis Date   Hypertension     Social History   Tobacco Use   Smoking status: Never   Smokeless tobacco: Never  Substance Use Topics   Alcohol use: No   Drug use: No    Family History  Problem Relation Age of Onset   Cancer Mother        melamona   Heart disease Father     No Known Allergies  OBJECTIVE: Vitals:   05/16/24 1614  BP: (!) 164/89  Pulse: 79  Temp: 98.1 F (36.7 C)  TempSrc: Temporal  SpO2: 97%  Weight: 180 lb (81.6 kg)  Height: 5' 10 (1.778 m)     Body mass index is 25.83 kg/m.   Physical Exam General/constitutional: no distress, pleasant HEENT: Normocephalic, PER, Conj Clear, EOMI, Oropharynx clear Neck supple CV: rrr no mrg Lungs: clear to auscultation, normal respiratory effort Abd: Soft, Nontender Ext: no edema Skin: No Rash Neuro: nonfocal MSK: no peripheral joint swelling/tenderness/warmth; back spines nontender  Lab: Lab Results  Component Value Date   WBC 5.1 02/15/2024   HGB 13.3 02/15/2024   HCT 40.5 02/15/2024   MCV 85.8 02/15/2024   PLT 197 02/15/2024   Last metabolic panel Lab Results  Component Value Date   GLUCOSE 119 (H) 02/15/2024   NA 138 02/15/2024   K 4.4 02/15/2024   CL 104 02/15/2024   CO2 27 02/15/2024   BUN 17 02/15/2024   CREATININE 0.87 02/15/2024   GFR 98.30 10/05/2023   CALCIUM  9.1 02/15/2024   PROT 6.6 02/15/2024   ALBUMIN 4.1 10/05/2023   LABGLOB 2.3 09/30/2020   AGRATIO 2.0 09/30/2020   BILITOT 0.5 02/15/2024   ALKPHOS 151 (H) 10/05/2023   AST 23 02/15/2024   ALT 26 02/15/2024    Microbiology:  Serology:  Imaging: Reviewed  10/07/23 liver  u/s IMPRESSION: No cholelithiasis or sonographic evidence for acute cholecystitis.   02/23/24 elastography ULTRASOUND ABDOMEN:   Fatty infiltration liver.   ULTRASOUND HEPATIC ELASTOGRAPHY:   Median kPa:  9   Diagnostic category: >9 kPa and ?13 kPa: suggestive of cACLD, but needs further testing  Assessment/plan: Problem List Items Addressed This Visit   None Visit Diagnoses       Chronic viral hepatitis B without delta agent and without coma (HCC)    -  Primary     Advanced hepatic fibrosis              68 yo male [place] born patient but immig[place] the [place] , referred for hep b management.  As of initial visit with me 10/12/23 wasn't cleared what the acuity of the infection was: 1) Discussed with patient this could be chronic hep b with acute flare (given family hx likely with hep b and wife also has chronic hep b) 2) This could also be acute infection   Sister with hep b/c cirrhosis s/p liver transplant doing well Patient doing well at this time I will closely monitor how he does for the next  6 months. If acute hep b hopefully he 'll achieve functional cure.   If functional cure achieved, will discuss titrating off tenofovir (keeping in mind family hx cirrhosis)   If no functional cure will likely keep on tenofovir.   Will also discuss plan to monitor for liver cancer in the future depending on what the hep b status is    No labs today at request as he feels welll   F/u in 6 weeks  Continue tenofovir  --------------------- 11/18/23 id clinic f/u Doing well on tenofovir No sx of n/v/diarrhea/jaundice-itch Will repeat labs today -- will consider fibrotest or elastography once the lft is much improved  Again if functional cure is achieved, will assume it's acute hep b infection and potentially get him off tenofovir and monitor   Labs today   F/u 3 months for repeat labs   ------------- 02/15/24 id clinic assessment Doing well Tolerating  entecavir 10/2023 labs normalized lft  Again unclear if recent acute hep b or chronic hep b that had flared recently   -elastography ordered (this if suggestive advance fibrosis, will keep on entecavir even if he functionally cure his hepatitis b) -continue entecavir for now -labs today -f/u 3 months to discuss labs/elastography    05/16/24 id clinic assessment We reviewed elastography, and it does suggest advance fibrosis I'll plan to keep him on entecavir indefinitely  No labs needed today  Follow up every 6 months for labs and ultrasound hcc screening  Advance regular healthy  lifestyle to prevent NASH   His wife is available here and she is also on treatment for hepatitis b as well. She'll eventually establish care here. She is currently getting medication/care from [place], waiting for immigration paperwork.      Follow-up: Return in about 6 months (around 11/13/2024).  Constance ONEIDA Passer, MD [facility] 05/16/2024, 4:17 PM

## 2024-07-05 ENCOUNTER — Ambulatory Visit

## 2024-07-05 VITALS — BP 138/78 | HR 78 | Ht 69.0 in | Wt 183.2 lb

## 2024-07-05 DIAGNOSIS — Z0000 Encounter for general adult medical examination without abnormal findings: Secondary | ICD-10-CM | POA: Diagnosis not present

## 2024-07-05 NOTE — Progress Notes (Signed)
 Subjective:   Erik Mcgee is a 68 y.o. male who presents for a Medicare Annual Wellness Visit.  Allergies (verified) Patient has no known allergies.   History: Past Medical History:  Diagnosis Date   Hypertension    History reviewed. No pertinent surgical history. Family History  Problem Relation Age of Onset   Cancer Mother        melamona   Heart disease Father    Social History   Occupational History   Not on file  Tobacco Use   Smoking status: Never   Smokeless tobacco: Never  Substance and Sexual Activity   Alcohol use: No   Drug use: No   Sexual activity: Yes   Tobacco Counseling Counseling given: Not Answered  SDOH Screenings   Food Insecurity: No Food Insecurity (07/05/2024)  Housing: Unknown (07/05/2024)  Transportation Needs: No Transportation Needs (07/05/2024)  Utilities: Not At Risk (07/05/2024)  Depression (PHQ2-9): Low Risk  (07/05/2024)  Financial Resource Strain: Low Risk  (08/09/2023)   Received from [facility]  Physical Activity: Insufficiently Active (07/05/2024)  Social Connections: Socially Integrated (07/05/2024)  Stress: No Stress Concern Present (07/05/2024)  Tobacco Use: Low Risk  (07/05/2024)  Health Literacy: Adequate Health Literacy (07/05/2024)   Depression Screen    07/05/2024    4:09 PM 02/15/2024    4:05 PM 01/10/2024    3:12 PM 10/12/2023    9:02 AM 10/05/2023    8:33 AM 03/08/2023    8:59 AM 12/02/2021    2:26 PM  PHQ 2/9 Scores  PHQ - 2 Score 0 0 0 0 0 0 0  PHQ- 9 Score 0 0          Goals Addressed               This Visit's Progress     Patient Stated (pt-stated)        Patient stated he plans to continue being active       Visit info / Clinical Intake: Medicare Wellness Visit Type:: Initial Annual Wellness Visit Medicare Wellness Visit Mode:: In-person (required for WTM) Interpreter Needed?: No Pre-visit prep was completed: yes AWV questionnaire completed by patient prior to visit?: no Living arrangements::  lives with spouse/significant other Patient's Overall Health Status Rating: very good Typical amount of pain: none Does pain affect daily life?: no Are you currently prescribed opioids?: no  Dietary Habits and Nutritional Risks How many meals a day?: 3 Eats fruit and vegetables daily?: yes Most meals are obtained by: preparing own meals; having others provide food In the last 2 weeks, have you had any of the following?: -- (none) Diabetic:: (!) yes Any non-healing wounds?: no How often do you check your BS?: 3; as needed (on 07/04/24 - 106) Would you like to be referred to a Nutritionist or for Diabetic Management? : no  Functional Status Activities of Daily Living (to include ambulation/medication): Independent Ambulation: Independent with device- listed below Home Assistive Devices/Equipment: Eyeglasses; Dentures (specify type) Medication Administration: Independent Home Management: Independent Manage your own finances?: yes Primary transportation is: driving Concerns about vision?: no *vision screening is required for WTM* (referred to [facility]) Concerns about hearing?: no  Fall Screening Falls in the past year?: 0 Number of falls in past year: 0 Was there an injury with Fall?: 0 Fall Risk Category Calculator: 0 Patient Fall Risk Level: Low Fall Risk  Fall Risk Patient at Risk for Falls Due to: No Fall Risks Fall risk Follow up: Falls prevention discussed; Falls  evaluation completed  Home and Transportation Safety: All rugs have non-skid backing?: N/A, no rugs All stairs or steps have railings?: N/A, no stairs Grab bars in the bathtub or shower?: (!) no Have non-skid surface in bathtub or shower?: yes Good home lighting?: yes Regular seat belt use?: yes Hospital stays in the last year:: no  Cognitive Assessment Difficulty concentrating, remembering, or making decisions? : no Will 6CIT or Mini Cog be Completed: yes What year is it?: 0 points What month is  it?: 0 points Give patient an address phrase to remember (5 components): [address] [place], [place] About what time is it?: 0 points Count backwards from 20 to 1: 0 points Say the months of the year in reverse: 0 points Repeat the address phrase from earlier: 0 points 6 CIT Score: 0 points  Advance Directives (For Healthcare) Does Patient Have a Medical Advance Directive?: No Would patient like information on creating a medical advance directive?: Yes (MAU/Ambulatory/Procedural Areas - Information given)  Reviewed/Updated  Reviewed/Updated: All        Objective:    Today's Vitals   07/05/24 1558  BP: 138/78  Pulse: 78  SpO2: 98%  Weight: 183 lb 3.2 oz (83.1 kg)  Height: 5' 9 (1.753 m)   Body mass index is 27.05 kg/m.  Current Medications (verified) Outpatient Encounter Medications as of 07/05/2024  Medication Sig   amLODipine  (NORVASC ) 5 MG tablet Take 1 tablet by mouth once daily   tenofovir (VIREAD) 300 MG tablet Take 300 mg by mouth daily.   tadalafil  (ADCIRCA /CIALIS ) 20 MG tablet Take 0.5-1 tablets (10-20 mg total) by mouth every other day as needed for erectile dysfunction. (Patient not taking: Reported on 07/05/2024)   No facility-administered encounter medications on file as of 07/05/2024.   Hearing/Vision screen Hearing Screening - Comments:: Denies hearing difficulties   Vision Screening - Comments:: Wears rx glasses - referred to [facility] Immunizations and Health Maintenance Health Maintenance  Topic Date Due   Pneumococcal Vaccine: 50+ Years (1 of 2 - PCV) Never done   Zoster Vaccines- Shingrix (1 of 2) Never done   Diabetic kidney evaluation - Urine ACR  09/19/2020   FOOT EXAM  03/19/2021   OPHTHALMOLOGY EXAM  03/18/2024   Influenza Vaccine  Never done   HEMOGLOBIN A1C  04/03/2024   COVID-19 Vaccine (1 - 2025-26 season) Never done   Diabetic kidney evaluation - eGFR measurement  02/14/2025   Medicare Annual Wellness (AWV)  07/05/2025    Colonoscopy  07/28/2027   Hepatitis C Screening  Completed   Meningococcal B Vaccine  Aged Out   DTaP/Tdap/Td  Discontinued   Hepatitis B Vaccines 19-59 Average Risk  Discontinued        Assessment/Plan:  This is a routine wellness examination for Erik Mcgee.  Patient Care Team: [name] Emil Schanz, MD as PCP - General (Internal Medicine)  I have personally reviewed and noted the following in the patient's chart:   Medical and social history Use of alcohol, tobacco or illicit drugs  Current medications and supplements including opioid prescriptions. Functional ability and status Nutritional status Physical activity Advanced directives List of other physicians Hospitalizations, surgeries, and ER visits in previous 12 months Vitals Screenings to include cognitive, depression, and falls Referrals and appointments  No orders of the defined types were placed in this encounter.  In addition, I have reviewed and discussed with patient certain preventive protocols, quality metrics, and best practice recommendations. A written personalized care plan for preventive services as  well as general preventive health recommendations were provided to patient.   Verdie CHRISTELLA Saba, CMA   07/05/2024   Return in 1 year (on 07/05/2025).  After Visit Summary: (In Person-Declined) Patient declined AVS at this time.  Nurse Notes: Referred the pt to [facility] for a Diabetic Eye Exam for 2025.

## 2024-07-05 NOTE — Patient Instructions (Addendum)
 Mr. Erik Mcgee,  Thank you for taking the time for your Medicare Wellness Visit. I appreciate your continued commitment to your health goals. Please review the care plan we discussed, and feel free to reach out if I can assist you further.  Please note that Annual Wellness Visits do not include a physical exam. Some assessments may be limited, especially if the visit was conducted virtually. If needed, we may recommend an in-person follow-up with your provider.  Ongoing Care Seeing your primary care provider every 3 to 6 months helps us  monitor your health and provide consistent, personalized care.   Referrals If a referral was made during today's visit and you haven't received any updates within two weeks, please contact the referred provider directly to check on the status.  Recommended Screenings:  Health Maintenance  Topic Date Due   Pneumococcal Vaccine for age over 88 (1 of 2 - PCV) Never done   Zoster (Shingles) Vaccine (1 of 2) Never done   Yearly kidney health urinalysis for diabetes  09/19/2020   Complete foot exam   03/19/2021   Eye exam for diabetics  03/18/2024   Flu Shot  Never done   Hemoglobin A1C  04/03/2024   COVID-19 Vaccine (1 - 2025-26 season) Never done   Yearly kidney function blood test for diabetes  02/14/2025   Medicare Annual Wellness Visit  07/05/2025   Colon Cancer Screening  07/28/2027   Hepatitis C Screening  Completed   Meningitis B Vaccine  Aged Out   DTaP/Tdap/Td vaccine  Discontinued   Hepatitis B Vaccine  Discontinued       07/05/2024    3:59 PM  Advanced Directives  Does Patient Have a Medical Advance Directive? No  Would patient like information on creating a medical advance directive? Yes (MAU/Ambulatory/Procedural Areas - Information given)    Vision: Annual vision screenings are recommended for early detection of glaucoma, cataracts, and diabetic retinopathy. These exams can also reveal signs of chronic conditions such as diabetes and high  blood pressure.  Dental: Annual dental screenings help detect early signs of oral cancer, gum disease, and other conditions linked to overall health, including heart disease and diabetes.

## 2024-07-17 ENCOUNTER — Encounter: Payer: Self-pay | Admitting: Emergency Medicine

## 2024-07-17 ENCOUNTER — Ambulatory Visit (INDEPENDENT_AMBULATORY_CARE_PROVIDER_SITE_OTHER): Admitting: Emergency Medicine

## 2024-07-17 VITALS — BP 134/80 | HR 75 | Temp 97.6°F | Ht 69.0 in | Wt 185.0 lb

## 2024-07-17 DIAGNOSIS — E1159 Type 2 diabetes mellitus with other circulatory complications: Secondary | ICD-10-CM | POA: Diagnosis not present

## 2024-07-17 DIAGNOSIS — I152 Hypertension secondary to endocrine disorders: Secondary | ICD-10-CM | POA: Diagnosis not present

## 2024-07-17 DIAGNOSIS — Z8719 Personal history of other diseases of the digestive system: Secondary | ICD-10-CM | POA: Diagnosis not present

## 2024-07-17 LAB — URINALYSIS
Bilirubin Urine: NEGATIVE
Hgb urine dipstick: NEGATIVE
Ketones, ur: NEGATIVE
Leukocytes,Ua: NEGATIVE
Nitrite: NEGATIVE
Specific Gravity, Urine: 1.015 (ref 1.000–1.030)
Total Protein, Urine: NEGATIVE
Urine Glucose: NEGATIVE
Urobilinogen, UA: 0.2 (ref 0.0–1.0)
pH: 7 (ref 5.0–8.0)

## 2024-07-17 LAB — CBC WITH DIFFERENTIAL/PLATELET
Basophils Absolute: 0 K/uL (ref 0.0–0.1)
Basophils Relative: 0.7 % (ref 0.0–3.0)
Eosinophils Absolute: 0.1 K/uL (ref 0.0–0.7)
Eosinophils Relative: 2.1 % (ref 0.0–5.0)
HCT: 39.5 % (ref 39.0–52.0)
Hemoglobin: 13.1 g/dL (ref 13.0–17.0)
Lymphocytes Relative: 46.1 % — ABNORMAL HIGH (ref 12.0–46.0)
Lymphs Abs: 2.3 K/uL (ref 0.7–4.0)
MCHC: 33.1 g/dL (ref 30.0–36.0)
MCV: 84.2 fl (ref 78.0–100.0)
Monocytes Absolute: 0.5 K/uL (ref 0.1–1.0)
Monocytes Relative: 9.8 % (ref 3.0–12.0)
Neutro Abs: 2.1 K/uL (ref 1.4–7.7)
Neutrophils Relative %: 41.3 % — ABNORMAL LOW (ref 43.0–77.0)
Platelets: 186 K/uL (ref 150.0–400.0)
RBC: 4.69 Mil/uL (ref 4.22–5.81)
RDW: 13.8 % (ref 11.5–15.5)
WBC: 5 K/uL (ref 4.0–10.5)

## 2024-07-17 LAB — POCT GLYCOSYLATED HEMOGLOBIN (HGB A1C): Hemoglobin A1C: 6.4 % — AB (ref 4.0–5.6)

## 2024-07-17 NOTE — Patient Instructions (Signed)
 Mantenimiento de la salud despus de los 65 aos de edad Health Maintenance After Age 67 Despus de los 65 aos de edad, corre un riesgo mayor de Film/video editor enfermedades e infecciones a largo plazo, como tambin de sufrir lesiones por cadas. Las cadas son la causa principal de las fracturas de huesos y lesiones en la cabeza de personas mayores de 65 aos de edad. Recibir cuidados preventivos de forma regular puede ayudarlo a mantenerse saludable y en buen [place]. Los cuidados preventivos incluyen realizarse anlisis de forma regular y Forensic psychologist en el estilo de vida segn las recomendaciones del mdico. Converse con el mdico sobre lo siguiente: Las pruebas de deteccin y los anlisis que debe International aid/development worker. Una prueba de deteccin es un estudio que se para Engineer, manufacturing la presencia de una enfermedad cuando no tiene sntomas. Un plan de dieta y ejercicios adecuado para usted. Qu debo saber sobre las pruebas de deteccin y los anlisis para prevenir cadas? Realizarse pruebas de deteccin y [employer] es la mejor manera de Engineer, manufacturing un problema de salud de forma temprana. El diagnstico y tratamiento tempranos le brindan la mejor oportunidad de Chief Operating Officer las afecciones mdicas que son comunes despus de los 65 aos de edad. Ciertas afecciones y elecciones de estilo de vida pueden hacer que sea ms propenso a sufrir Engineer, site. El mdico puede recomendarle lo siguiente: Controles regulares de la visin. Una visin deficiente y afecciones como las cataratas pueden hacer que sea ms propenso a sufrir Engineer, site. Si usa  lentes, asegrese de obtener una receta actualizada si su visin cambia. Revisin de medicamentos. Revise regularmente con el mdico todos los medicamentos que toma, incluidos los medicamentos de [place]. Consulte al [employer] efectos secundarios que pueden hacer que sea ms propenso a sufrir Engineer, site. Informe al mdico si alguno de los medicamentos que toma lo hace sentir mareado o  somnoliento. Controles de fuerza y equilibrio. El mdico puede recomendar ciertos estudios para controlar su fuerza y equilibrio al estar de pie, al caminar o al cambiar de posicin. Examen de los pies. El dolor y Materials engineer en los pies, como tambin no utilizar el calzado adecuado, pueden hacer que sea ms propenso a sufrir Engineer, site. Pruebas de deteccin, que incluyen las siguientes: Pruebas de deteccin para la osteoporosis. La osteoporosis es una afeccin que hace que los huesos se tornen ms dbiles y se quiebren con ms facilidad. Pruebas de deteccin para la presin arterial. Los cambios en la presin arterial y los medicamentos para Chief Operating Officer la presin arterial pueden hacerlo sentir mareado. Prueba de deteccin de la depresin. Es ms probable que sufra una cada si tiene miedo a caerse, se siente deprimido o se siente incapaz de Probation officer. Prueba de deteccin de consumo de alcohol. Beber demasiado alcohol puede afectar su equilibrio y puede hacer que sea ms propenso a sufrir Engineer, site. Siga estas indicaciones en su casa: Estilo de vida No beba alcohol si: Su mdico le indica no hacerlo. Si bebe alcohol: Limite la cantidad que bebe a lo siguiente: De 0 a 1 medida por da para las mujeres. De 0 a 2 medidas por da para los hombres. Sepa cunta cantidad de alcohol hay en las bebidas que toma. En los [address], una medida equivale a una botella de cerveza de 12 oz (355 ml), un vaso de vino de 5 oz (148 ml) o un vaso de una bebida alcohlica de alta graduacin de 1 oz (44 ml). No consuma ningn producto que  contenga nicotina o tabaco. Estos productos incluyen cigarrillos, tabaco para mascar y aparatos de vapeo, como los cigarrillos electrnicos. Si necesita ayuda para dejar de consumir estos productos, consulte al [employer]. Actividad  Siga un programa de ejercicio regular para mantenerse en forma. Esto lo ayudar a Radio producer equilibrio. Consulte al  mdico qu tipos de ejercicios son adecuados para usted. Si necesita un bastn o un andador, selo segn las recomendaciones del mdico. Utilice calzado con buen apoyo y suela antideslizante. Seguridad  Retire los [employer] puedan causar tropiezos tales como alfombras, cables u obstculos. Instale equipos de seguridad, como barras para sostn en los baos y barandas de seguridad en las escaleras. Mantenga las habitaciones y los pasillos bien iluminados. Indicaciones generales Hable con el mdico sobre sus riesgos de sufrir una cada. Infrmele a su mdico si: Se cae. Asegrese de informarle a su mdico acerca de todas las cadas, incluso aquellas que parecen ser [employer]. Se siente mareado, cansado (tiene fatiga) o siente que pierde el equilibrio. Use los medicamentos de venta libre y los recetados solamente como se lo haya indicado el mdico. Estos incluyen suplementos. Siga una dieta sana y [place] un peso saludable. Una dieta saludable incluye productos lcteos descremados, carnes bajas en contenido de grasa ([place]), fibra de granos enteros, frijoles y [place] frutas y verduras. Mantngase al da con las vacunas. Realcese los estudios de rutina de la salud, dentales y de Wellsite geologist. Resumen Tener un estilo de vida saludable y recibir cuidados preventivos pueden ayudar a Research scientist (physical sciences) salud y el bienestar despus de los 65 aos de [place]. Realizarse pruebas de deteccin y anlisis es la mejor manera de Engineer, manufacturing un problema de salud de forma temprana y ayudarlo a Automotive engineer una cada. El diagnstico y tratamiento tempranos le brindan la mejor oportunidad de Chief Operating Officer las afecciones mdicas ms comunes en las personas mayores de 65 aos de edad. Las cadas son la causa principal de las fracturas de huesos y lesiones en la cabeza de personas mayores de 65 aos de edad. Tome precauciones para evitar una cada en su casa. Trabaje con el mdico para saber qu cambios que puede hacer para mejorar su salud y  [place], y para prevenir las cadas. Esta informacin no tiene Theme park manager el consejo del mdico. Asegrese de hacerle al mdico cualquier pregunta que tenga. Document Revised: 01/22/2021 Document Reviewed: 01/22/2021 Elsevier Patient Education  2024 [employer].

## 2024-07-17 NOTE — Progress Notes (Signed)
 Erik Mcgee 68 y.o.   Chief Complaint  Patient presents with   Medical Management of Chronic Issues    6 month f/u    HISTORY OF PRESENT ILLNESS: This is a 68 y.o. male here for 62-month follow-up of chronic medical conditions including diabetes and hypertension Overall doing well.  Has no complaints or medical concerns today.  HPI   Prior to Admission medications   Medication Sig Start Date End Date Taking? Authorizing Provider  amLODipine  (NORVASC ) 5 MG tablet Take 1 tablet by mouth once daily 03/18/24  Yes Shy Guallpa, [place], MD  tenofovir (VIREAD) 300 MG tablet Take 300 mg by mouth daily.   Yes [provider]  tadalafil  (ADCIRCA /CIALIS ) 20 MG tablet Take 0.5-1 tablets (10-20 mg total) by mouth every other day as needed for erectile dysfunction. Patient not taking: Reported on 07/17/2024 07/05/18   [name] Emil Schanz, MD    No Known Allergies  Patient Active Problem List   Diagnosis Date Noted   Statin intolerance 03/19/2020   Erectile dysfunction 07/05/2018   History of gastroesophageal reflux (GERD) 09/10/2017   Hypertension associated with diabetes (HCC) 07/28/2017    Past Medical History:  Diagnosis Date   Hypertension     No past surgical history on file.  Social History   Socioeconomic History   Marital status: Married    Spouse name: Not on file   Number of children: Not on file   Years of education: Not on file   Highest education level: Not on file  Occupational History   Not on file  Tobacco Use   Smoking status: Never   Smokeless tobacco: Never  Substance and Sexual Activity   Alcohol use: No   Drug use: No   Sexual activity: Yes  Other Topics Concern   Not on file  Social History Narrative   Not on file   Social Drivers of Health   Financial Resource Strain: Low Risk  (08/09/2023)   Received from [facility]   Overall Financial Resource Strain (CARDIA)    Difficulty of Paying Living Expenses: Not hard at all  Food  Insecurity: No Food Insecurity (07/05/2024)   Hunger Vital Sign    Worried About Running Out of Food in the Last Year: Never true    Ran Out of Food in the Last Year: Never true  Transportation Needs: No Transportation Needs (07/05/2024)   PRAPARE - Administrator, Civil Service (Medical): No    Lack of Transportation (Non-Medical): No  Physical Activity: Insufficiently Active (07/05/2024)   Exercise Vital Sign    Days of Exercise per Week: 3 days    Minutes of Exercise per Session: 30 min  Stress: No Stress Concern Present (07/05/2024)   [employer] of Occupational Health - Occupational Stress Questionnaire    Feeling of Stress: Not at all  Social Connections: Socially Integrated (07/05/2024)   Social Connection and Isolation Panel    Frequency of Communication with Friends and Family: More than three times a week    Frequency of Social Gatherings with Friends and Family: More than three times a week    Attends Religious Services: More than 4 times per year    Active Member of [employer] or Organizations: Yes    Attends Banker Meetings: More than 4 times per year    Marital Status: Married  Catering Manager Violence: Not At Risk (07/05/2024)   Humiliation, Afraid, Rape, and Kick questionnaire    Fear of Current or Ex-Partner: No  Emotionally Abused: No    Physically Abused: No    Sexually Abused: No    Family History  Problem Relation Age of Onset   Cancer Mother        melamona   Heart disease Father      Review of Systems  Constitutional: Negative.  Negative for chills and fever.  HENT: Negative.  Negative for congestion and sore throat.   Respiratory: Negative.  Negative for cough and shortness of breath.   Cardiovascular: Negative.  Negative for chest pain and palpitations.  Gastrointestinal: Negative.  Negative for abdominal pain, diarrhea, nausea and vomiting.  Genitourinary: Negative.  Negative for dysuria and hematuria.  Skin: Negative.   Negative for rash.  Neurological: Negative.  Negative for dizziness and headaches.  All other systems reviewed and are negative.   Vitals:   07/17/24 1454  BP: 134/80  Pulse: 75  Temp: 97.6 F (36.4 C)  SpO2: 100%    Physical Exam Vitals reviewed.  Constitutional:      Appearance: Normal appearance.  HENT:     Head: Normocephalic.     Mouth/Throat:     Mouth: Mucous membranes are moist.     Pharynx: Oropharynx is clear.  Eyes:     Extraocular Movements: Extraocular movements intact.     Pupils: Pupils are equal, round, and reactive to light.  Cardiovascular:     Rate and Rhythm: Normal rate and regular rhythm.     Pulses: Normal pulses.     Heart sounds: Normal heart sounds.  Pulmonary:     Effort: Pulmonary effort is normal.     Breath sounds: Normal breath sounds.  Abdominal:     Palpations: Abdomen is soft.     Tenderness: There is no abdominal tenderness.  Musculoskeletal:     Cervical back: No tenderness.  Lymphadenopathy:     Cervical: No cervical adenopathy.  Skin:    General: Skin is warm and dry.     Capillary Refill: Capillary refill takes less than 2 seconds.  Neurological:     General: No focal deficit present.     Mental Status: He is alert and oriented to person, place, and time.  Psychiatric:        Mood and Affect: Mood normal.        Behavior: Behavior normal.    Results for orders placed or performed in visit on 07/17/24 (from the past 24 hours)  POCT HgB A1C     Status: Abnormal   Collection Time: 07/17/24  3:03 PM  Result Value Ref Range   Hemoglobin A1C 6.4 (A) 4.0 - 5.6 %   HbA1c POC (<> result, manual entry)     HbA1c, POC (prediabetic range)     HbA1c, POC (controlled diabetic range)       ASSESSMENT & PLAN: A total of 40 minutes was spent with the patient and counseling/coordination of care regarding preparing for this visit, review of most recent office visit notes, review of multiple chronic medical conditions and their  management, cardiovascular risks associated with hypertension and diabetes, review of all medications, review of most recent bloodwork results including interpretation of today's hemoglobin A1c, review of health maintenance items, education on nutrition, prognosis, documentation, and need for follow up.   Problem List Items Addressed This Visit       Cardiovascular and Mediastinum   Hypertension associated with diabetes (HCC) - Primary   BP Readings from Last 3 Encounters:  07/17/24 134/80  07/05/24 138/78  05/16/24 (!) 164/89  Lab Results  Component Value Date   HGBA1C 6.4 (A) 07/17/2024  Well-controlled hypertension Continue amlodipine  5 mg daily Well-controlled diabetes off medication Diet and nutrition discussed Cardiovascular risks associated with hypertension and diabetes discussed Recommend blood work and urine testing today Follow-up in 6 months       Relevant Orders   POCT HgB A1C (Completed)   Urinalysis   Microalbumin / creatinine urine ratio   CBC with Differential/Platelet   Comprehensive metabolic panel with GFR   Lipid panel     Other   History of gastroesophageal reflux (GERD)   Well-controlled and asymptomatic. Diet and nutrition discussed      Patient Instructions  Mantenimiento de la salud despus de los 65 aos de edad Health Maintenance After Age 43 Despus de los 65 aos de edad, corre un riesgo mayor de film/video editor enfermedades e infecciones a largo plazo, como tambin de sufrir lesiones por cadas. Las cadas son la causa principal de las fracturas de huesos y lesiones en la cabeza de personas mayores de 65 aos de edad. Recibir cuidados preventivos de forma regular puede ayudarlo a mantenerse saludable y en buen [place]. Los cuidados preventivos incluyen realizarse anlisis de forma regular y forensic psychologist en el estilo de vida segn las recomendaciones del mdico. Converse con el mdico sobre lo siguiente: Las pruebas de deteccin y los  anlisis que debe international aid/development worker. Una prueba de deteccin es un estudio que se para engineer, manufacturing la presencia de una enfermedad cuando no tiene sntomas. Un plan de dieta y ejercicios adecuado para usted. Qu debo saber sobre las pruebas de deteccin y los anlisis para prevenir cadas? Realizarse pruebas de deteccin y [employer] es la mejor manera de engineer, manufacturing un problema de salud de forma temprana. El diagnstico y tratamiento tempranos le brindan la mejor oportunidad de chief operating officer las afecciones mdicas que son comunes despus de los 65 aos de edad. Ciertas afecciones y elecciones de estilo de vida pueden hacer que sea ms propenso a sufrir engineer, site. El mdico puede recomendarle lo siguiente: Controles regulares de la visin. Una visin deficiente y afecciones como las cataratas pueden hacer que sea ms propenso a sufrir engineer, site. Si usa  lentes, asegrese de obtener una receta actualizada si su visin cambia. Revisin de medicamentos. Revise regularmente con el mdico todos los medicamentos que toma, incluidos los medicamentos de [place]. Consulte al enterprise products efectos secundarios que pueden hacer que sea ms propenso a sufrir engineer, site. Informe al mdico si alguno de los medicamentos que toma lo hace sentir mareado o somnoliento. Controles de fuerza y equilibrio. El mdico puede recomendar ciertos estudios para controlar su fuerza y equilibrio al estar de pie, al caminar o al cambiar de posicin. Examen de los pies. El dolor y materials engineer en los pies, como tambin no utilizar el calzado adecuado, pueden hacer que sea ms propenso a sufrir engineer, site. Pruebas de deteccin, que incluyen las siguientes: Pruebas de deteccin para la osteoporosis. La osteoporosis es una afeccin que hace que los huesos se tornen ms dbiles y se quiebren con ms facilidad. Pruebas de deteccin para la presin arterial. Los cambios en la presin arterial y los medicamentos para chief operating officer la presin arterial pueden  hacerlo sentir mareado. Prueba de deteccin de la depresin. Es ms probable que sufra una cada si tiene miedo a caerse, se siente deprimido o se siente incapaz de probation officer. Prueba de deteccin de consumo de alcohol. Beber demasiado alcohol puede afectar su equilibrio y  puede hacer que sea ms propenso a sufrir engineer, site. Siga estas indicaciones en su casa: Estilo de vida No beba alcohol si: Su mdico le indica no hacerlo. Si bebe alcohol: Limite la cantidad que bebe a lo siguiente: De 0 a 1 medida por da para las mujeres. De 0 a 2 medidas por da para los hombres. Sepa cunta cantidad de alcohol hay en las bebidas que toma. En los [address], una medida equivale a una botella de cerveza de 12 oz (355 ml), un vaso de vino de 5 oz (148 ml) o un vaso de una bebida alcohlica de alta graduacin de 1 oz (44 ml). No consuma ningn producto que contenga nicotina o tabaco. Estos productos incluyen cigarrillos, tabaco para theatre manager y aparatos de vapeo, como los cigarrillos electrnicos. Si necesita ayuda para dejar de consumir estos productos, consulte al american express. Actividad  Siga un programa de ejercicio regular para mantenerse en forma. Esto lo ayudar a radio producer equilibrio. Consulte al mdico qu tipos de ejercicios son adecuados para usted. Si necesita un bastn o un andador, selo segn las recomendaciones del mdico. Utilice calzado con buen apoyo y suela antideslizante. Seguridad  Retire los autonation puedan causar tropiezos tales como alfombras, cables u obstculos. Instale equipos de seguridad, como barras para sostn en los baos y barandas de seguridad en las escaleras. Mantenga las habitaciones y los pasillos bien iluminados. Indicaciones generales Hable con el mdico sobre sus riesgos de sufrir una cada. Infrmele a su mdico si: Se cae. Asegrese de informarle a su mdico acerca de todas las cadas, incluso aquellas que parecen ser liberty global. Se  siente mareado, cansado (tiene fatiga) o siente que pierde el equilibrio. Use los medicamentos de venta libre y los recetados solamente como se lo haya indicado el mdico. Estos incluyen suplementos. Siga una dieta sana y [place] un peso saludable. Una dieta saludable incluye productos lcteos descremados, carnes bajas en contenido de grasa ([place]), fibra de granos enteros, frijoles y [place] frutas y verduras. Mantngase al da con las vacunas. Realcese los estudios de rutina de la salud, dentales y de wellsite geologist. Resumen Tener un estilo de vida saludable y recibir cuidados preventivos pueden ayudar a research scientist (physical sciences) salud y el bienestar despus de los 65 aos de [place]. Realizarse pruebas de deteccin y anlisis es la mejor manera de engineer, manufacturing un problema de salud de forma temprana y ayudarlo a automotive engineer una cada. El diagnstico y tratamiento tempranos le brindan la mejor oportunidad de chief operating officer las afecciones mdicas ms comunes en las personas mayores de 65 aos de edad. Las cadas son la causa principal de las fracturas de huesos y lesiones en la cabeza de personas mayores de 65 aos de edad. Tome precauciones para evitar una cada en su casa. Trabaje con el mdico para saber qu cambios que puede hacer para mejorar su salud y [place], y para prevenir las cadas. Esta informacin no tiene theme park manager el consejo del mdico. Asegrese de hacerle al mdico cualquier pregunta que tenga. Document Revised: 01/22/2021 Document Reviewed: 01/22/2021 Elsevier Patient Education  2024 Elsevier Inc.    Emil Schaumann, MD [facility]

## 2024-07-17 NOTE — Assessment & Plan Note (Signed)
Well-controlled and asymptomatic. Diet and nutrition discussed

## 2024-07-17 NOTE — Assessment & Plan Note (Signed)
 BP Readings from Last 3 Encounters:  07/17/24 134/80  07/05/24 138/78  05/16/24 (!) 164/89   Lab Results  Component Value Date   HGBA1C 6.4 (A) 07/17/2024  Well-controlled hypertension Continue amlodipine  5 mg daily Well-controlled diabetes off medication Diet and nutrition discussed Cardiovascular risks associated with hypertension and diabetes discussed Recommend blood work and urine testing today Follow-up in 6 months

## 2024-07-18 ENCOUNTER — Ambulatory Visit: Payer: Self-pay | Admitting: Emergency Medicine

## 2024-07-18 LAB — LIPID PANEL
Cholesterol: 206 mg/dL — ABNORMAL HIGH (ref 0–200)
HDL: 57.6 mg/dL (ref >39.00)
LDL Cholesterol: 124 mg/dL — ABNORMAL HIGH (ref 0–99)
NonHDL: 147.92
Total CHOL/HDL Ratio: 4
Triglycerides: 121 mg/dL (ref 0.0–149.0)
VLDL: 24.2 mg/dL (ref 0.0–40.0)

## 2024-07-18 LAB — COMPREHENSIVE METABOLIC PANEL WITH GFR
ALT: 23 U/L (ref 0–53)
AST: 20 U/L (ref 0–37)
Albumin: 4.6 g/dL (ref 3.5–5.2)
Alkaline Phosphatase: 65 U/L (ref 39–117)
BUN: 16 mg/dL (ref 6–23)
CO2: 27 meq/L (ref 19–32)
Calcium: 9.3 mg/dL (ref 8.4–10.5)
Chloride: 102 meq/L (ref 96–112)
Creatinine, Ser: 0.85 mg/dL (ref 0.40–1.50)
GFR: 90.15 mL/min (ref >60.00)
Glucose, Bld: 113 mg/dL — ABNORMAL HIGH (ref 70–99)
Potassium: 4 meq/L (ref 3.5–5.1)
Sodium: 139 meq/L (ref 135–145)
Total Bilirubin: 0.5 mg/dL (ref 0.2–1.2)
Total Protein: 7 g/dL (ref 6.0–8.3)

## 2024-07-18 LAB — MICROALBUMIN / CREATININE URINE RATIO
Creatinine,U: 89.2 mg/dL
Microalb Creat Ratio: 15.9 mg/g (ref 0.0–30.0)
Microalb, Ur: 1.4 mg/dL (ref 0.0–1.9)

## 2024-09-11 ENCOUNTER — Ambulatory Visit: Admitting: Emergency Medicine

## 2024-09-11 ENCOUNTER — Encounter: Payer: Self-pay | Admitting: Emergency Medicine

## 2024-09-11 VITALS — BP 136/88 | HR 68 | Temp 98.4°F | Ht 69.0 in | Wt 185.0 lb

## 2024-09-11 DIAGNOSIS — R140 Abdominal distension (gaseous): Secondary | ICD-10-CM | POA: Insufficient documentation

## 2024-09-11 DIAGNOSIS — I152 Hypertension secondary to endocrine disorders: Secondary | ICD-10-CM

## 2024-09-11 DIAGNOSIS — B181 Chronic viral hepatitis B without delta-agent: Secondary | ICD-10-CM | POA: Insufficient documentation

## 2024-09-11 DIAGNOSIS — E1159 Type 2 diabetes mellitus with other circulatory complications: Secondary | ICD-10-CM

## 2024-09-11 LAB — CBC WITH DIFFERENTIAL/PLATELET
Basophils Absolute: 0 K/uL (ref 0.0–0.1)
Basophils Relative: 0.9 % (ref 0.0–3.0)
Eosinophils Absolute: 0.1 K/uL (ref 0.0–0.7)
Eosinophils Relative: 1.6 % (ref 0.0–5.0)
HCT: 39.8 % (ref 39.0–52.0)
Hemoglobin: 13.1 g/dL (ref 13.0–17.0)
Lymphocytes Relative: 39.7 % (ref 12.0–46.0)
Lymphs Abs: 2 K/uL (ref 0.7–4.0)
MCHC: 32.9 g/dL (ref 30.0–36.0)
MCV: 84.8 fl (ref 78.0–100.0)
Monocytes Absolute: 0.5 K/uL (ref 0.1–1.0)
Monocytes Relative: 10.2 % (ref 3.0–12.0)
Neutro Abs: 2.4 K/uL (ref 1.4–7.7)
Neutrophils Relative %: 47.6 % (ref 43.0–77.0)
Platelets: 197 K/uL (ref 150.0–400.0)
RBC: 4.7 Mil/uL (ref 4.22–5.81)
RDW: 14.7 % (ref 11.5–15.5)
WBC: 5.1 K/uL (ref 4.0–10.5)

## 2024-09-11 LAB — COMPREHENSIVE METABOLIC PANEL WITH GFR
ALT: 20 U/L (ref 3–53)
AST: 19 U/L (ref 5–37)
Albumin: 4.7 g/dL (ref 3.5–5.2)
Alkaline Phosphatase: 65 U/L (ref 39–117)
BUN: 11 mg/dL (ref 6–23)
CO2: 30 meq/L (ref 19–32)
Calcium: 9.5 mg/dL (ref 8.4–10.5)
Chloride: 101 meq/L (ref 96–112)
Creatinine, Ser: 0.56 mg/dL (ref 0.40–1.50)
GFR: 102.15 mL/min
Glucose, Bld: 115 mg/dL — ABNORMAL HIGH (ref 70–99)
Potassium: 4.1 meq/L (ref 3.5–5.1)
Sodium: 136 meq/L (ref 135–145)
Total Bilirubin: 0.4 mg/dL (ref 0.2–1.2)
Total Protein: 7.5 g/dL (ref 6.0–8.3)

## 2024-09-11 LAB — LIPID PANEL
Cholesterol: 168 mg/dL (ref 28–200)
HDL: 60.2 mg/dL
LDL Cholesterol: 92 mg/dL (ref 10–99)
NonHDL: 107.74
Total CHOL/HDL Ratio: 3
Triglycerides: 78 mg/dL (ref 10.0–149.0)
VLDL: 15.6 mg/dL (ref 0.0–40.0)

## 2024-09-11 LAB — VITAMIN B12: Vitamin B-12: 407 pg/mL (ref 211–911)

## 2024-09-11 LAB — LIPASE: Lipase: 44 U/L (ref 11.0–59.0)

## 2024-09-11 LAB — HEMOGLOBIN A1C: Hgb A1c MFr Bld: 6.7 % — ABNORMAL HIGH (ref 4.6–6.5)

## 2024-09-11 NOTE — Assessment & Plan Note (Signed)
 Well-controlled hypertension Continue amlodipine  5 mg daily Well-controlled diabetes off medication Diet and nutrition discussed Cardiovascular risks associated with hypertension and diabetes discussed Recommend blood work and urine testing today Follow-up in 6 months

## 2024-09-11 NOTE — Patient Instructions (Signed)
 Distensin abdominal: Qu debe saber Abdominal Bloating: What to Know  La distensin del vientre ocurre cuando se puede sentir el vientre lleno, apretado o dolorido. Tambin puede verse ms grande o hinchado. Esto tambin se denomina "distensin abdominal". Las causas comunes de la distensin del vientre incluyen lo siguiente: Psychologist, sport and exercise. Dificultad para defecar (estreimiento). Problemas para digerir los alimentos. Intolerancia a water quality scientist. Esto significa que no se puede publishing rights manager, un azcar natural en los productos lcteos. Enfermedad celaca. Esto significa que no puede digerir el gluten, una protena que se encuentra en el trigo. Movimiento lento de los alimentos en el estmago y el intestino delgado. Comer en exceso. Sndrome de colon irritable (SCI). Este sndrome afecta el intestino grueso. Enfermedad de reflujo gastroesofgico (ERGE). Ocurre cuando el cido estomacal vuelve a la garganta. Retencin urinaria. Sucede cuando la vejiga no puede vaciarse por completo. Siga estas indicaciones en su casa: Comida y bebida Evite comer en exceso. Trate de no tragar aire cuando habla o come. Evite ingerir alimentos cuando est acostado. Evite los alimentos y las bebidas que puedan provocar la distensin. Segn la causa, puede incluir: Alimentos que pueden causar gases, como el brcoli, repollo, counsellor y frijoles cocidos. Bebidas gaseosas. Caramelos duros. Goma de theatre manager. Productos lcteos. Medicamentos Use los medicamentos nicamente segn las indicaciones. Pregntele al mdico si debera tomar un probitico. Los probiticos tienen bacterias o levaduras buenas que pueden favorecer la digestin. Indicaciones generales Haga actividad fsica con regularidad para ayudar con la distensin por gases y con cualquier problema para defecar. Es posible que tenga que tomar medidas para tratar o prevenir la dificultad para defecar, por ejemplo: Tomar medicamentos que le  ayuden a advertising copywriter. Consumir alimentos ricos en fibra, como legumbres, cereales integrales, y frutas y verduras frescas. Beber ms lquido segn le hayan indicado. Comunquese con un mdico si: Vomita o siente ganas de vomitar. Tiene deposiciones acuosas, o diarrea, que no se detienen. Siente que chief technology officer en el vientre empeora y no se alivia con los medicamentos. Aumenta o baja de peso de [place] inesperada. Solicite ayuda de inmediato si: Medical laboratory scientific officer. Tiene dificultad para respirar. Siente que le falta el aire. Tiene dificultad para orinar. Tiene orina oscura o con mal olor. Tiene sangre en la materia fecal o heces alquitranadas, de color oscuro. Estos sntomas pueden customer service manager. Llame al 911 de inmediato. No espere a ver si los sntomas desaparecen. No conduzca por sus propios medios officemax incorporated. Esta informacin no tiene theme park manager el consejo del mdico. Asegrese de hacerle al mdico cualquier pregunta que tenga. Document Revised: 04/23/2023 Document Reviewed: 04/23/2023 Elsevier Patient Education  2024 [employer].

## 2024-09-11 NOTE — Assessment & Plan Note (Signed)
 Clinically stable.  Asymptomatic. Has follow-up appointment in 2 months with infectious disease doctor

## 2024-09-11 NOTE — Assessment & Plan Note (Signed)
 Clinically stable.  No red flag signs or symptoms. History of hepatitis B in the recent past Benign abdominal examination. Differential diagnosis discussed Diet and nutrition discussed Recommend blood work today Recommend GI evaluation and possible upper endoscopy History of colonic polyps with last endoscopy about 2 years ago Advised to stay well-hydrated and contact the office if no better or worse during the next several days or weeks.

## 2024-09-11 NOTE — Progress Notes (Addendum)
 Erik Mcgee 69 y.o.   Chief Complaint  Patient presents with   Bloated    Pt stated he has been having bloating issue for a month, normal happens after he eats or drink.    HISTORY OF PRESENT ILLNESS: This is a 69 y.o. male complaining of easy abdominal bloating on and off for the past 5 weeks Denies diarrhea or constipation.  Denies nausea or vomiting.  Denies early satiety.  Denies weight loss Denies abdominal pain No other associated symptoms No other complaints or medical concerns today.  HPI   Prior to Admission medications  Medication Sig Start Date End Date Taking? Authorizing Provider  amLODipine  (NORVASC ) 5 MG tablet Take 1 tablet by mouth once daily 03/18/24  Yes Chai Verdejo, Emil Schanz, MD  tadalafil  (ADCIRCA /CIALIS ) 20 MG tablet Take 0.5-1 tablets (10-20 mg total) by mouth every other day as needed for erectile dysfunction. Patient not taking: Reported on 09/11/2024 07/05/18   Alletta Mattos Jose, MD  tenofovir (VIREAD) 300 MG tablet Take 300 mg by mouth daily. Patient not taking: Reported on 09/11/2024    [provider]    Allergies[1]  Patient Active Problem List   Diagnosis Date Noted   Abdominal bloating 09/11/2024   Statin intolerance 03/19/2020   Erectile dysfunction 07/05/2018   History of gastroesophageal reflux (GERD) 09/10/2017   Hypertension associated with diabetes (HCC) 07/28/2017    Past Medical History:  Diagnosis Date   Hypertension     No past surgical history on file.  Social History   Socioeconomic History   Marital status: Married    Spouse name: Not on file   Number of children: Not on file   Years of education: Not on file   Highest education level: Not on file  Occupational History   Not on file  Tobacco Use   Smoking status: Never   Smokeless tobacco: Never  Substance and Sexual Activity   Alcohol use: No   Drug use: No   Sexual activity: Yes  Other Topics Concern   Not on file  Social History Narrative    Not on file   Social Drivers of Health   Tobacco Use: Low Risk (08/16/2024)   Received from [facility]   Patient History    Smoking Tobacco Use: Never    Smokeless Tobacco Use: Never    Passive Exposure: Not on file  Financial Resource Strain: Low Risk (08/09/2023)   Received from [facility]   Overall Financial Resource Strain (CARDIA)    Difficulty of Paying Living Expenses: Not hard at all  Food Insecurity: No Food Insecurity (07/05/2024)   Epic    Worried About Radiation Protection Practitioner of Food in the Last Year: Never true    Ran Out of Food in the Last Year: Never true  Transportation Needs: No Transportation Needs (07/05/2024)   Epic    Lack of Transportation (Medical): No    Lack of Transportation (Non-Medical): No  Physical Activity: Insufficiently Active (07/05/2024)   Exercise Vital Sign    Days of Exercise per Week: 3 days    Minutes of Exercise per Session: 30 min  Stress: No Stress Concern Present (07/05/2024)   [employer] of Occupational Health - Occupational Stress Questionnaire    Feeling of Stress: Not at all  Social Connections: Socially Integrated (07/05/2024)   Social Connection and Isolation Panel    Frequency of Communication with Friends and Family: More than three times a week    Frequency of Social Gatherings with Friends and Family:  More than three times a week    Attends Religious Services: More than 4 times per year    Active Member of Clubs or Organizations: Yes    Attends Banker Meetings: More than 4 times per year    Marital Status: Married  Catering Manager Violence: Not At Risk (07/05/2024)   Epic    Fear of Current or Ex-Partner: No    Emotionally Abused: No    Physically Abused: No    Sexually Abused: No  Depression (PHQ2-9): Low Risk (07/05/2024)   Depression (PHQ2-9)    PHQ-2 Score: 0  Alcohol Screen: Not on file  Housing: Unknown (07/05/2024)   Epic    Unable to Pay for Housing in the Last Year: No    Number of Times Moved in  the Last Year: Not on file    Homeless in the Last Year: No  Utilities: Not At Risk (07/05/2024)   Epic    Threatened with loss of utilities: No  Health Literacy: Adequate Health Literacy (07/05/2024)   B1300 Health Literacy    Frequency of need for help with medical instructions: Never    Family History  Problem Relation Age of Onset   Cancer Mother        melamona   Heart disease Father      Review of Systems  Constitutional: Negative.  Negative for chills and fever.  HENT: Negative.  Negative for congestion and sore throat.   Respiratory: Negative.  Negative for cough and shortness of breath.   Cardiovascular: Negative.  Negative for chest pain and palpitations.  Gastrointestinal:  Negative for abdominal pain, diarrhea, nausea and vomiting.  Genitourinary: Negative.  Negative for dysuria and hematuria.  Skin: Negative.  Negative for rash.  Neurological: Negative.  Negative for dizziness and headaches.  All other systems reviewed and are negative.   Vitals:   09/11/24 1610  BP: 136/88  Pulse: 68  Temp: 98.4 F (36.9 C)  SpO2: 95%    Physical Exam Vitals reviewed.  Constitutional:      Appearance: Normal appearance.  HENT:     Head: Normocephalic.     Mouth/Throat:     Mouth: Mucous membranes are moist.     Pharynx: Oropharynx is clear.  Eyes:     Extraocular Movements: Extraocular movements intact.     Pupils: Pupils are equal, round, and reactive to light.  Cardiovascular:     Rate and Rhythm: Normal rate and regular rhythm.     Pulses: Normal pulses.     Heart sounds: Normal heart sounds.  Pulmonary:     Effort: Pulmonary effort is normal.     Breath sounds: Normal breath sounds.  Abdominal:     General: There is no distension.     Palpations: Abdomen is soft.     Tenderness: There is no abdominal tenderness. There is no guarding.  Musculoskeletal:        General: Normal range of motion.     Cervical back: No tenderness.  Lymphadenopathy:      Cervical: No cervical adenopathy.  Skin:    General: Skin is warm and dry.  Neurological:     General: No focal deficit present.     Mental Status: He is alert and oriented to person, place, and time.  Psychiatric:        Mood and Affect: Mood normal.        Behavior: Behavior normal.      ASSESSMENT & PLAN: Problem List Items Addressed This  Visit       Cardiovascular and Mediastinum   Hypertension associated with diabetes (HCC)   Well-controlled hypertension Continue amlodipine  5 mg daily Well-controlled diabetes off medication Diet and nutrition discussed Cardiovascular risks associated with hypertension and diabetes discussed Recommend blood work and urine testing today Follow-up in 6 months        Digestive   Chronic viral hepatitis B without delta agent and without coma (HCC)   Clinically stable.  Asymptomatic. Has follow-up appointment in 2 months with infectious disease doctor        Other   Abdominal bloating - Primary   Clinically stable.  No red flag signs or symptoms. History of hepatitis B in the recent past Benign abdominal examination. Differential diagnosis discussed Diet and nutrition discussed Recommend blood work today Recommend GI evaluation and possible upper endoscopy History of colonic polyps with last endoscopy about 2 years ago Advised to stay well-hydrated and contact the office if no better or worse during the next several days or weeks.      Relevant Orders   CBC with Differential/Platelet   Comprehensive metabolic panel with GFR   Hemoglobin A1c   Vitamin B12   VITAMIN D  25 Hydroxy (Vit-D Deficiency, Fractures)   Lipid panel   Lipase   Ambulatory referral to Gastroenterology     Patient Instructions  Distensin abdominal: Qu debe saber Abdominal Bloating: What to Know  La distensin del vientre ocurre cuando se puede sentir el vientre lleno, apretado o dolorido. Tambin puede verse ms grande o hinchado. Esto tambin se  denomina distensin abdominal. Las causas comunes de la distensin del vientre incluyen lo siguiente: Psychologist, sport and exercise. Dificultad para defecar (estreimiento). Problemas para digerir los alimentos. Intolerancia a water quality scientist. Esto significa que no se puede publishing rights manager, un azcar natural en los productos lcteos. Enfermedad celaca. Esto significa que no puede digerir el gluten, una protena que se encuentra en el trigo. Movimiento lento de los alimentos en el estmago y el intestino delgado. Comer en exceso. Sndrome de colon irritable (SCI). Este sndrome afecta el intestino grueso. Enfermedad de reflujo gastroesofgico (ERGE). Ocurre cuando el cido estomacal vuelve a la garganta. Retencin urinaria. Sucede cuando la vejiga no puede vaciarse por completo. Siga estas indicaciones en su casa: Comida y bebida Evite comer en exceso. Trate de no tragar aire cuando habla o come. Evite ingerir alimentos cuando est acostado. Evite los alimentos y las bebidas que puedan provocar la distensin. Segn la causa, puede incluir: Alimentos que pueden causar gases, como el brcoli, repollo, counsellor y frijoles cocidos. Bebidas gaseosas. Caramelos duros. Goma de theatre manager. Productos lcteos. Medicamentos Use los medicamentos nicamente segn las indicaciones. Pregntele al mdico si debera tomar un probitico. Los probiticos tienen bacterias o levaduras buenas que pueden favorecer la digestin. Indicaciones generales Haga actividad fsica con regularidad para ayudar con la distensin por gases y con cualquier problema para defecar. Es posible que tenga que tomar medidas para tratar o prevenir la dificultad para defecar, por ejemplo: Tomar medicamentos que le ayuden a advertising copywriter. Consumir alimentos ricos en fibra, como legumbres, cereales integrales, y frutas y verduras frescas. Beber ms lquido segn le hayan indicado. Comunquese con un mdico si: Vomita o siente ganas de  vomitar. Tiene deposiciones acuosas, o diarrea, que no se detienen. Siente que chief technology officer en el vientre empeora y no se alivia con los medicamentos. Aumenta o baja de peso de [place] inesperada. Solicite ayuda de inmediato si: Medical laboratory scientific officer. Tiene dificultad para respirar.  Siente que le falta el aire. Tiene dificultad para orinar. Tiene orina oscura o con mal olor. Tiene sangre en la materia fecal o heces alquitranadas, de color oscuro. Estos sntomas pueden customer service manager. Llame al 911 de inmediato. No espere a ver si los sntomas desaparecen. No conduzca por sus propios medios officemax incorporated. Esta informacin no tiene theme park manager el consejo del mdico. Asegrese de hacerle al mdico cualquier pregunta que tenga. Document Revised: 04/23/2023 Document Reviewed: 04/23/2023 Elsevier Patient Education  2024 [employer] Inc.     Emil Schaumann, MD [facility]     [1] No Known Allergies

## 2024-09-12 ENCOUNTER — Ambulatory Visit: Payer: Self-pay | Admitting: Emergency Medicine

## 2024-09-12 LAB — VITAMIN D 25 HYDROXY (VIT D DEFICIENCY, FRACTURES): VITD: 25.21 ng/mL — ABNORMAL LOW (ref 30.00–100.00)

## 2024-10-02 ENCOUNTER — Ambulatory Visit: Admitting: Internal Medicine

## 2024-10-23 ENCOUNTER — Ambulatory Visit: Admitting: Internal Medicine

## 2024-11-22 ENCOUNTER — Ambulatory Visit: Admitting: Infectious Disease

## 2025-07-19 ENCOUNTER — Ambulatory Visit

## 2025-07-19 ENCOUNTER — Encounter: Admitting: Emergency Medicine
